# Patient Record
Sex: Female | Born: 1980
Health system: Southern US, Community
[De-identification: ages and names within clinical notes are randomized; demographics above are authoritative.]

## PROBLEM LIST (undated history)

## (undated) DIAGNOSIS — B019 Varicella without complication: Secondary | ICD-10-CM

## (undated) DIAGNOSIS — J301 Allergic rhinitis due to pollen: Secondary | ICD-10-CM

## (undated) DIAGNOSIS — R519 Headache, unspecified: Secondary | ICD-10-CM

## (undated) DIAGNOSIS — R51 Headache: Secondary | ICD-10-CM

## (undated) DIAGNOSIS — G40909 Epilepsy, unspecified, not intractable, without status epilepticus: Secondary | ICD-10-CM

## (undated) HISTORY — DX: Varicella without complication: B01.9

## (undated) HISTORY — DX: Headache, unspecified: R51.9

## (undated) HISTORY — DX: Headache: R51

## (undated) HISTORY — DX: Allergic rhinitis due to pollen: J30.1

## (undated) HISTORY — DX: Epilepsy, unspecified, not intractable, without status epilepticus: G40.909

---

## 2018-12-21 ENCOUNTER — Encounter: Payer: Self-pay | Admitting: Family Medicine

## 2018-12-21 ENCOUNTER — Ambulatory Visit (INDEPENDENT_AMBULATORY_CARE_PROVIDER_SITE_OTHER): Payer: BLUE CROSS/BLUE SHIELD

## 2018-12-21 ENCOUNTER — Ambulatory Visit (INDEPENDENT_AMBULATORY_CARE_PROVIDER_SITE_OTHER): Payer: BLUE CROSS/BLUE SHIELD | Admitting: Family Medicine

## 2018-12-21 VITALS — BP 102/80 | HR 83 | Temp 98.4°F | Resp 18 | Ht 61.0 in | Wt 137.4 lb

## 2018-12-21 DIAGNOSIS — M79672 Pain in left foot: Secondary | ICD-10-CM

## 2018-12-21 DIAGNOSIS — Z8349 Family history of other endocrine, nutritional and metabolic diseases: Secondary | ICD-10-CM

## 2018-12-21 DIAGNOSIS — R079 Chest pain, unspecified: Secondary | ICD-10-CM

## 2018-12-21 LAB — COMPREHENSIVE METABOLIC PANEL
ALT: 19 U/L (ref 0–35)
AST: 17 U/L (ref 0–37)
Albumin: 4.4 g/dL (ref 3.5–5.2)
Alkaline Phosphatase: 39 U/L (ref 39–117)
BUN: 8 mg/dL (ref 6–23)
CHLORIDE: 103 meq/L (ref 96–112)
CO2: 29 mEq/L (ref 19–32)
Calcium: 9.2 mg/dL (ref 8.4–10.5)
Creatinine, Ser: 0.58 mg/dL (ref 0.40–1.20)
GFR: 115.78 mL/min (ref >60.00)
GLUCOSE: 88 mg/dL (ref 70–99)
Potassium: 4 mEq/L (ref 3.5–5.1)
Sodium: 139 mEq/L (ref 135–145)
Total Bilirubin: 1.1 mg/dL (ref 0.2–1.2)
Total Protein: 6.7 g/dL (ref 6.0–8.3)

## 2018-12-21 LAB — CBC
HCT: 40.3 % (ref 36.0–46.0)
Hemoglobin: 13.7 g/dL (ref 12.0–15.0)
MCHC: 33.9 g/dL (ref 30.0–36.0)
MCV: 91 fl (ref 78.0–100.0)
Platelets: 233 10*3/uL (ref 150.0–400.0)
RBC: 4.44 Mil/uL (ref 3.87–5.11)
RDW: 12.5 % (ref 11.5–15.5)
WBC: 6.2 10*3/uL (ref 4.0–10.5)

## 2018-12-21 LAB — LIPID PANEL
Cholesterol: 210 mg/dL — ABNORMAL HIGH (ref 0–200)
HDL: 77.3 mg/dL (ref >39.00)
LDL Cholesterol: 120 mg/dL — ABNORMAL HIGH (ref 0–99)
NonHDL: 132.28
Total CHOL/HDL Ratio: 3
Triglycerides: 59 mg/dL (ref 0.0–149.0)
VLDL: 11.8 mg/dL (ref 0.0–40.0)

## 2018-12-21 LAB — B12 AND FOLATE PANEL
Folate: 9.8 ng/mL (ref >5.9)
Vitamin B-12: 569 pg/mL (ref 211–911)

## 2018-12-21 LAB — VITAMIN D 25 HYDROXY (VIT D DEFICIENCY, FRACTURES): VITD: 22.28 ng/mL — ABNORMAL LOW (ref 30.00–100.00)

## 2018-12-21 NOTE — Progress Notes (Signed)
Subjective:    Patient ID: Sandra Donovan, female    DOB: Dec 11, 1980, 38 y.o.   MRN: [id number]  HPI   Patient presents to clinic to establish with PCP.  Patient has a few complaints today including pain in her left foot/left big toe and also pain in her sternum for the past 2 years.  Patient denies any injury to her left foot.  But, the big toe has been hurting off and on for many months.  Patient states she did cut her toe nail slightly too short and wonders if this caused an ingrown?  Patient believes the pain in her sternum after she began lifting weights 2 years ago, says she heard a pop and now will hear a pop off and on and have some soreness in the sternum.  Denies any palpitations, feeling faint or dizzy, shortness of breath or wheezing.  Past medical, surgical, family and social history reviewed and updated in chart: Past Medical History:  Diagnosis Date  . Chicken pox   . Frequent headaches   . Hay fever   . Seizure disorder [facility](HCC)    Social History   Tobacco Use  . Smoking status: Never Smoker  . Smokeless tobacco: Never Used  Substance Use Topics  . Alcohol use: Yes    Comment: ocassionally   History reviewed. No pertinent surgical history.   Family History  Problem Relation Age of Onset  . Arthritis Mother   . Miscarriages / [place] Mother   . Arthritis Father   . Cancer Maternal Grandmother   . Alcohol abuse Maternal Grandfather   . Diabetes Maternal Grandfather   . Heart disease Maternal Grandfather   . Heart attack Maternal Grandfather   . Diabetes Paternal Grandmother   . Heart disease Paternal Grandmother   . Hearing loss Paternal Grandfather   . Cancer Paternal Grandfather     Review of Systems  Constitutional: Negative for chills, fatigue and fever.  HENT: Negative for congestion, ear pain, sinus pain and sore throat.   Eyes: Negative.   Respiratory: Negative for cough, shortness of breath and wheezing.   Cardiovascular: Negative for  palpitations and leg swelling. +central CP x2 years Gastrointestinal: Negative for abdominal pain, diarrhea, nausea and vomiting.  Genitourinary: Negative for dysuria, frequency and urgency.  Musculoskeletal: pain in left foot - left great toe Skin: Negative for color change, pallor and rash.  Neurological: Negative for syncope, light-headedness and headaches.  Psychiatric/Behavioral: The patient is not nervous/anxious.       Objective:   Physical Exam  Constitutional: She appears well-developed and well-nourished. No distress.  HENT:  Head: Normocephalic and atraumatic.  Eyes: Pupils are equal, round, and reactive to light. EOM are normal. No scleral icterus.  Neck: Normal range of motion. Neck supple. No tracheal deviation present.  Cardiovascular: Normal rate, regular rhythm and normal heart sounds. NO pain with palpation of sternum.   Pulmonary/Chest: Effort normal and breath sounds normal. No respiratory distress. She has no wheezes. She has no rales.  Abdominal: Soft. Bowel sounds are normal. There is no tenderness.  Neurological: She is alert and oriented to person, place, and time. Gait normal  Musculoskeletal: Range of motion of ankle and feet normal.  No deformity of feet. Skin: Skin is warm and dry. No pallor.  Some tenderness on right side of left great toenail, suspect possible ingrown toenail could be causing patient's pain. Psychiatric: She has a normal mood and affect. Her behavior is normal. Thought content normal.   Nursing  note and vitals reviewed.  Vitals:   12/21/18 0908  BP: 102/80  Pulse: 83  Resp: 18  Temp: 98.4 F (36.9 C)  SpO2: 97%      Assessment & Plan:   Chest pain in adult-EKG done in clinic and reviewed by me.  Shows normal sinus rhythm and unremarkable for any acute abnormality.  Pain could be from a pulled muscle, patient advised to monitor cell for any worsening pain.  We will also get chest x-ray in clinic to further evaluate.  Left foot  pain- suspect pain is related to an ingrown toenail, patient still would like foot x-ray to rule out any sort of bony abnormality.  We will get foot x-ray in clinic today.  Due to patient's family history of metabolic/intestinal disorders, we will get blood work today including CBC, CMP, lipid panel, thyroid panel, B12/folate, vitamin D.  Patient will follow-up in approximately 2 weeks for CPE and Pap smear.

## 2018-12-21 NOTE — Patient Instructions (Signed)
EKG is normal 

## 2018-12-22 LAB — THYROID PANEL WITH TSH
Free Thyroxine Index: 2.6 (ref 1.4–3.8)
T3 UPTAKE: 30 % (ref 22–35)
T4, Total: 8.6 ug/dL (ref 5.1–11.9)
TSH: 1.89 mIU/L

## 2018-12-25 ENCOUNTER — Telehealth: Payer: Self-pay

## 2018-12-25 DIAGNOSIS — M79672 Pain in left foot: Secondary | ICD-10-CM

## 2018-12-25 NOTE — Telephone Encounter (Signed)
Copied from CRM [id number]. Topic: Quick Communication - Lab Results (Clinic Use ONLY) >> Dec 25, 2018  2:19 PM Crist Infante wrote: 1.  Pt would like lab results released to mychart. 2.  referral not in the chart for podiatry as noted.

## 2018-12-25 NOTE — Addendum Note (Signed)
Addended by: Leanora Cover on: 12/25/2018 04:15 PM   Modules accepted: Orders

## 2019-01-04 ENCOUNTER — Encounter: Payer: BLUE CROSS/BLUE SHIELD | Admitting: Family Medicine

## 2019-01-11 ENCOUNTER — Ambulatory Visit (INDEPENDENT_AMBULATORY_CARE_PROVIDER_SITE_OTHER): Payer: BLUE CROSS/BLUE SHIELD | Admitting: Family Medicine

## 2019-01-11 ENCOUNTER — Encounter: Payer: Self-pay | Admitting: Family Medicine

## 2019-01-11 ENCOUNTER — Encounter: Payer: Self-pay | Admitting: Podiatry

## 2019-01-11 ENCOUNTER — Ambulatory Visit (INDEPENDENT_AMBULATORY_CARE_PROVIDER_SITE_OTHER): Payer: BLUE CROSS/BLUE SHIELD | Admitting: Podiatry

## 2019-01-11 ENCOUNTER — Other Ambulatory Visit (HOSPITAL_COMMUNITY)
Admission: RE | Admit: 2019-01-11 | Discharge: 2019-01-11 | Disposition: A | Discharge status: Home / Self Care | Payer: BLUE CROSS/BLUE SHIELD | Source: Ambulatory Visit | Attending: Family Medicine | Admitting: Family Medicine

## 2019-01-11 VITALS — BP 111/64 | HR 71

## 2019-01-11 VITALS — BP 102/68 | HR 67 | Temp 98.0°F | Resp 16 | Ht 61.0 in | Wt 139.4 lb

## 2019-01-11 DIAGNOSIS — Z124 Encounter for screening for malignant neoplasm of cervix: Secondary | ICD-10-CM

## 2019-01-11 DIAGNOSIS — Z0000 Encounter for general adult medical examination without abnormal findings: Secondary | ICD-10-CM | POA: Diagnosis not present

## 2019-01-11 DIAGNOSIS — L600 Ingrowing nail: Secondary | ICD-10-CM

## 2019-01-11 DIAGNOSIS — Z1231 Encounter for screening mammogram for malignant neoplasm of breast: Secondary | ICD-10-CM

## 2019-01-11 DIAGNOSIS — M205X9 Other deformities of toe(s) (acquired), unspecified foot: Secondary | ICD-10-CM

## 2019-01-11 DIAGNOSIS — M2020 Hallux rigidus, unspecified foot: Secondary | ICD-10-CM

## 2019-01-11 LAB — HM PAP SMEAR: HM Pap smear: NEGATIVE

## 2019-01-11 NOTE — Progress Notes (Signed)
Subjective:    Patient ID: Sandra Donovan, female    DOB: 10/25/1981, 38 y.o.   MRN: [id number]  HPI   Patient presents to clinic for complete physical exam and Pap smear.  She is feeling well today and does not have any complaints.  She is seeing podiatry later this afternoon to further investigate her foot pain.  Lab work reviewed with patient, overall labs very good.  Vitamin D slightly low but she will replace with oral vitamin D supplement.   Patient is working on [employer] and regular exercise.  She is trying to eat more vegetables and lean meats and to at least walk 5 times per week.  Patient also is doing some weight training.  Patient sees eye doctor every 1 to 2 years.  Sees dentist twice per year.  She always wears seatbelt while in vehicle.  She is happy to report that her parents have moved to [place] to be closer to her.   Vaccines are up-to-date.  Patient's maternal grandmother passed away from breast cancer at the age of 59, we will plan to order mammogram for patient today.   Social History   Tobacco Use  . Smoking status: Never Smoker  . Smokeless tobacco: Never Used  Substance Use Topics  . Alcohol use: Yes    Comment: ocassionally   History reviewed. No pertinent surgical history.  Family History  Problem Relation Age of Onset  . Arthritis Mother   . Miscarriages / [place] Mother   . Arthritis Father   . Cancer Maternal Grandmother   . Alcohol abuse Maternal Grandfather   . Diabetes Maternal Grandfather   . Heart disease Maternal Grandfather   . Heart attack Maternal Grandfather   . Diabetes Paternal Grandmother   . Heart disease Paternal Grandmother   . Hearing loss Paternal Grandfather   . Cancer Paternal Grandfather     Review of Systems  Constitutional: Negative for chills, fatigue and fever.  HENT: Negative for congestion, ear pain, sinus pain and sore throat.   Eyes: Negative.   Respiratory: Negative for cough,  shortness of breath and wheezing.   Cardiovascular: Negative for chest pain, palpitations and leg swelling.  Gastrointestinal: Negative for abdominal pain, diarrhea, nausea and vomiting.  Genitourinary: Negative for dysuria, frequency and urgency.  Musculoskeletal: Negative for arthralgias and myalgias.  Skin: Negative for color change, pallor and rash.  Neurological: Negative for syncope, light-headedness and headaches.  Psychiatric/Behavioral: The patient is not nervous/anxious.       Objective:   Physical Exam Vitals signs and nursing note reviewed.  Constitutional:      Appearance: She is well-developed.  HENT:     Head: Normocephalic and atraumatic.     Right Ear: External ear normal.     Left Ear: External ear normal.     Nose: Nose normal.  Eyes:     General: No scleral icterus.       Right eye: No discharge.        Left eye: No discharge.     Conjunctiva/sclera: Conjunctivae normal.     Pupils: Pupils are equal, round, and reactive to light.  Neck:     Musculoskeletal: Normal range of motion and neck supple.     Trachea: No tracheal deviation.  Cardiovascular:     Rate and Rhythm: Normal rate and regular rhythm.     Heart sounds: Normal heart sounds. No murmur. No friction rub. No gallop.   Pulmonary:     Effort: Pulmonary  effort is normal. No respiratory distress.     Breath sounds: Normal breath sounds. No wheezing or rales.  Chest:     Chest wall: No tenderness.     Breasts:        Right: Normal. No swelling, bleeding, inverted nipple, mass, nipple discharge, skin change or tenderness.        Left: Normal. No swelling, bleeding, inverted nipple, mass, nipple discharge, skin change or tenderness.  Abdominal:     General: Bowel sounds are normal.     Palpations: Abdomen is soft.     Tenderness: There is no abdominal tenderness. There is no guarding.     Hernia: There is no hernia in the right inguinal area or left inguinal area.  Genitourinary:    Labia:         Right: No rash, tenderness, lesion or injury.        Left: No rash, tenderness, lesion or injury.      Vagina: Normal. No signs of injury. No vaginal discharge, tenderness, bleeding or lesions.     Cervix: Normal.     Uterus: Not tender.      Adnexa:        Right: No tenderness.         Left: No tenderness.    Musculoskeletal: Normal range of motion.        General: No deformity.  Lymphadenopathy:     Cervical: No cervical adenopathy.     Upper Body:     Right upper body: No supraclavicular, axillary or pectoral adenopathy.     Left upper body: No supraclavicular, axillary or pectoral adenopathy.     Lower Body: No right inguinal adenopathy. No left inguinal adenopathy.  Skin:    General: Skin is warm and dry.     Capillary Refill: Capillary refill takes less than 2 seconds.     Coloration: Skin is not pale.     Findings: No erythema.  Neurological:     Mental Status: She is alert and oriented to person, place, and time.     Cranial Nerves: No cranial nerve deficit.  Psychiatric:        Behavior: Behavior normal.        Thought Content: Thought content normal.     Vitals:   01/11/19 0812  BP: 102/68  Pulse: 67  Resp: 16  Temp: 98 F (36.7 C)  SpO2: 97%       Assessment & Plan:   Well adult exam, mammogram screening, Pap smear - patient appears to be a healthy 38 year old female.  Due to family history of breast cancer in maternal grandmother at a younger age, we will get mammogram ordered today.  Patient encouraged to continue following a healthy diet with lots of lean proteins, vegetables, lower carbs/lower sugars.  Also encouraged to keep up good water intake and do regular physical activity at least 3 to 5 days/week.  Patient sees eye doctor and dentist regularly.  She always wears seatbelt when in vehicle.  Also discussed skin safety in the sun, recommended wearing sunscreen of SPF of at least 30 when outdoors for extended period of time, also discussed wearing longer  sleeves and widebrimmed hat to protect skin.  Patient given information on how to call and set up her mammogram appointment she is aware someone will be contacting her in regards to Pap smear results.  Patient advised to return annually for wellness visit and is aware she can return to clinic sooner if any issues  arise.

## 2019-01-11 NOTE — Progress Notes (Signed)
This patient presents to the office stating that she is experiencing pain and discomfort along the inside border of the left great toe.  She says that there is been mild pain and discomfort for the last 2 to 3 months.  She points to an area on her toe that is red and inflamed and painful to the touch.  She states that her foot is actually better today at this visit.  She says that she has soaked her foot and trimmed her nails which has mildly helped.  She has sought no professional treatment.  She presents the office today for further evaluation and treatment of her painful left big toe.  Vascular  Dorsalis pedis and posterior tibial pulses are palpable  B/L.  Capillary return  WNL.  Temperature gradient is  WNL.  Skin turgor  WNL  Sensorium  Senn Weinstein monofilament wire  WNL. Normal tactile sensation.  Nail Exam  Patient has normal nails with no evidence of bacterial or fungal infection. Incurvation medial border left great toenail.  Orthopedic  Exam  Muscle tone and muscle strength  WNL.  No limitations of motion feet  B/L.  No crepitus or joint effusion noted.  Foot type is unremarkable and digits show no abnormalities.  Bony prominences are unremarkable. Functional hallux limitus noted B/l.  Skin  No open lesions.  Normal skin texture and turgor.   Ingrown toenail left hallux.  Functional hallux limitus  1st MPJ  B/L.  IE.  Discussed this condition with this patient.  Discussed conservative treatment versus surgical treatment.  Patient desires time to think about future treatment.  Patient was noted to have a functional hallux limitus first MPJ bilateral.  Told this patient we can check on orthotic coverage and have that information for her when she returns for nail surgery. RTC prn.  Helane Gunther DPM

## 2019-01-13 LAB — CYTOLOGY - PAP
ADEQUACY: ABSENT
Diagnosis: NEGATIVE
HPV: NOT DETECTED

## 2019-01-15 ENCOUNTER — Encounter: Payer: Self-pay | Admitting: Podiatry

## 2019-01-15 ENCOUNTER — Ambulatory Visit (INDEPENDENT_AMBULATORY_CARE_PROVIDER_SITE_OTHER): Payer: BLUE CROSS/BLUE SHIELD | Admitting: Podiatry

## 2019-01-15 DIAGNOSIS — L03032 Cellulitis of left toe: Secondary | ICD-10-CM | POA: Diagnosis not present

## 2019-01-15 DIAGNOSIS — L600 Ingrowing nail: Secondary | ICD-10-CM | POA: Diagnosis not present

## 2019-01-15 NOTE — Progress Notes (Signed)
This patient presents to the office stating that she is experiencing pain and discomfort along the inside border of the left great toe.  She returns to the office for scheduled nail surgery.  Vascular  Dorsalis pedis and posterior tibial pulses are palpable  B/L.  Capillary return  WNL.  Temperature gradient is  WNL.  Skin turgor  WNL  Sensorium  Senn Weinstein monofilament wire  WNL. Normal tactile sensation.  Nail Exam  Patient has normal nails with no evidence of bacterial or fungal infection. Incurvation medial border left great toenail.  Orthopedic  Exam  Muscle tone and muscle strength  WNL.  No limitations of motion feet  B/L.  No crepitus or joint effusion noted.  Foot type is unremarkable and digits show no abnormalities.  Bony prominences are unremarkable. Functional hallux limitus noted B/l.  Skin  No open lesions.  Normal skin texture and turgor.   Ingrown toenail left hallux.   IE.  Discussed this condition with this patient.  Nail surgery left hallux.  Treatment options and alternatives discussed.  Recommended permanent phenol matrixectomy and patient agreed.  Left hallux  was prepped with alcohol and a toe block of 3cc of 2% lidocaine plain was administered in a digital toe block. .  The toe was then prepped with betadine solution . A tourniquet was applied to toe. The offending nail border was then excised and matrix tissue exposed.  Phenol was then applied to the matrix tissue followed by an alcohol wash.  Antibiotic ointment and a dry sterile dressing was applied.  The patient was dispensed instructions for aftercare. RTC 10 days     Helane Gunther DPM  Helane Gunther DPM

## 2019-01-15 NOTE — Patient Instructions (Signed)

## 2019-01-19 ENCOUNTER — Ambulatory Visit
Admission: RE | Admit: 2019-01-19 | Discharge: 2019-01-19 | Disposition: A | Discharge status: Home / Self Care | Payer: BLUE CROSS/BLUE SHIELD | Source: Ambulatory Visit | Attending: Family Medicine | Admitting: Family Medicine

## 2019-01-19 DIAGNOSIS — Z1231 Encounter for screening mammogram for malignant neoplasm of breast: Secondary | ICD-10-CM

## 2019-01-22 ENCOUNTER — Telehealth: Payer: Self-pay | Admitting: Podiatry

## 2019-01-23 ENCOUNTER — Other Ambulatory Visit: Payer: Self-pay | Admitting: Family Medicine

## 2019-01-23 DIAGNOSIS — R928 Other abnormal and inconclusive findings on diagnostic imaging of breast: Secondary | ICD-10-CM

## 2019-01-25 ENCOUNTER — Encounter: Payer: Self-pay | Admitting: Podiatry

## 2019-01-25 ENCOUNTER — Ambulatory Visit
Admission: RE | Admit: 2019-01-25 | Discharge: 2019-01-25 | Disposition: A | Discharge status: Home / Self Care | Payer: BLUE CROSS/BLUE SHIELD | Source: Ambulatory Visit | Attending: Family Medicine | Admitting: Family Medicine

## 2019-01-25 ENCOUNTER — Ambulatory Visit (INDEPENDENT_AMBULATORY_CARE_PROVIDER_SITE_OTHER): Payer: BLUE CROSS/BLUE SHIELD | Admitting: Podiatry

## 2019-01-25 ENCOUNTER — Other Ambulatory Visit: Payer: Self-pay | Admitting: Family Medicine

## 2019-01-25 DIAGNOSIS — N6314 Unspecified lump in the right breast, lower inner quadrant: Secondary | ICD-10-CM | POA: Diagnosis not present

## 2019-01-25 DIAGNOSIS — R928 Other abnormal and inconclusive findings on diagnostic imaging of breast: Secondary | ICD-10-CM

## 2019-01-25 DIAGNOSIS — M205X9 Other deformities of toe(s) (acquired), unspecified foot: Secondary | ICD-10-CM

## 2019-01-25 DIAGNOSIS — R922 Inconclusive mammogram: Secondary | ICD-10-CM | POA: Diagnosis not present

## 2019-01-25 DIAGNOSIS — N63 Unspecified lump in unspecified breast: Secondary | ICD-10-CM

## 2019-01-25 DIAGNOSIS — Z09 Encounter for follow-up examination after completed treatment for conditions other than malignant neoplasm: Secondary | ICD-10-CM

## 2019-01-25 DIAGNOSIS — M2020 Hallux rigidus, unspecified foot: Secondary | ICD-10-CM

## 2019-01-25 NOTE — Progress Notes (Signed)
This patient returns to the office following nail surgery ten days  ago.  The patient says toe has been soaked and bandaged as directed.  There has been improvement of the toe since the surgery has been performed. The patient presents for continued evaluation and treatment.  GENERAL APPEARANCE: Alert, conversant. Appropriately groomed. No acute distress.  VASCULAR: Pedal pulses palpable at  [employer] and PT bilateral.  Capillary refill time is immediate to all digits,  Normal temperature gradient.    NEUROLOGIC: sensation is normal to 5.07 monofilament at 5/5 sites bilateral.  Light touch is intact bilateral, Muscle strength normal.  MUSCULOSKELETAL: acceptable muscle strength, tone and stability bilateral.  Intrinsic muscluature intact bilateral.  Rectus appearance of foot and digits noted bilateral.   DERMATOLOGIC: skin color, texture, and turgor are within normal limits.  No preulcerative lesions or ulcers  are seen, no interdigital maceration noted.   NAILS  There is necrotic tissue along the nail groove  In the absence of redness swelling and pain.  DX  S/p nail surgery  ROV  Home instructions were discussed.  Patient to call the office if there are any questions or concerns. Patient wishes to acquire kinetic wedge orthoses from [facility].  She will be scheduled with Raiford Noble in March.   Helane Gunther DPM

## 2019-02-21 ENCOUNTER — Other Ambulatory Visit: Payer: BLUE CROSS/BLUE SHIELD | Admitting: Orthotics

## 2019-04-10 ENCOUNTER — Encounter: Payer: Self-pay | Admitting: Podiatry

## 2019-07-30 ENCOUNTER — Other Ambulatory Visit: Payer: Self-pay | Admitting: Family Medicine

## 2019-07-30 ENCOUNTER — Other Ambulatory Visit: Payer: Self-pay

## 2019-07-30 ENCOUNTER — Ambulatory Visit
Admission: RE | Admit: 2019-07-30 | Discharge: 2019-07-30 | Disposition: A | Discharge status: Home / Self Care | Payer: BC Managed Care – PPO | Source: Ambulatory Visit | Attending: Family Medicine | Admitting: Family Medicine

## 2019-07-30 DIAGNOSIS — N63 Unspecified lump in unspecified breast: Secondary | ICD-10-CM

## 2019-07-30 DIAGNOSIS — N6314 Unspecified lump in the right breast, lower inner quadrant: Secondary | ICD-10-CM | POA: Diagnosis not present

## 2019-07-31 ENCOUNTER — Other Ambulatory Visit: Payer: Self-pay | Admitting: Family Medicine

## 2019-07-31 DIAGNOSIS — N6310 Unspecified lump in the right breast, unspecified quadrant: Secondary | ICD-10-CM

## 2019-07-31 NOTE — Progress Notes (Signed)
Breast imaging 6 month follow ups ordered.

## 2019-11-13 ENCOUNTER — Encounter: Payer: Self-pay | Admitting: Family Medicine

## 2019-12-06 ENCOUNTER — Ambulatory Visit: Payer: BLUE CROSS/BLUE SHIELD | Admitting: Family Medicine

## 2020-01-14 ENCOUNTER — Ambulatory Visit: Payer: BLUE CROSS/BLUE SHIELD | Admitting: Family Medicine

## 2020-02-01 ENCOUNTER — Ambulatory Visit
Admission: RE | Admit: 2020-02-01 | Discharge: 2020-02-01 | Disposition: A | Discharge status: Home / Self Care | Payer: BC Managed Care – PPO | Source: Ambulatory Visit | Attending: Family Medicine | Admitting: Family Medicine

## 2020-02-01 ENCOUNTER — Other Ambulatory Visit: Payer: Self-pay

## 2020-02-01 DIAGNOSIS — N63 Unspecified lump in unspecified breast: Secondary | ICD-10-CM

## 2020-02-01 DIAGNOSIS — R922 Inconclusive mammogram: Secondary | ICD-10-CM | POA: Diagnosis not present

## 2020-02-01 DIAGNOSIS — N6489 Other specified disorders of breast: Secondary | ICD-10-CM | POA: Diagnosis not present

## 2020-03-24 ENCOUNTER — Ambulatory Visit: Payer: BC Managed Care – PPO | Admitting: Family Medicine

## 2020-03-24 ENCOUNTER — Encounter: Payer: Self-pay | Admitting: Family Medicine

## 2020-03-24 ENCOUNTER — Other Ambulatory Visit: Payer: Self-pay

## 2020-03-24 VITALS — BP 124/64 | HR 64 | Ht 61.0 in | Wt 146.0 lb

## 2020-03-24 DIAGNOSIS — Z7689 Persons encountering health services in other specified circumstances: Secondary | ICD-10-CM

## 2020-03-24 NOTE — Progress Notes (Signed)
Date:  03/24/2020   Name:  Sandra Donovan   DOB:  1981-09-12   MRN:  [id number]   Chief Complaint: Establish Care (needed  pcp- her last one moved out of area)  Patient is a 39  year old female who presents for a comprehensive physical exam. The patient reports the following problems: none. Health maintenance has been reviewed up to date.   Lab Results  Component Value Date   CREATININE 0.58 12/21/2018   BUN 8 12/21/2018   NA 139 12/21/2018   K 4.0 12/21/2018   CL 103 12/21/2018   CO2 29 12/21/2018   Lab Results  Component Value Date   CHOL 210 (H) 12/21/2018   HDL 77.30 12/21/2018   LDLCALC 120 (H) 12/21/2018   TRIG 59.0 12/21/2018   CHOLHDL 3 12/21/2018   Lab Results  Component Value Date   TSH 1.89 12/21/2018   No results found for: HGBA1C Lab Results  Component Value Date   WBC 6.2 12/21/2018   HGB 13.7 12/21/2018   HCT 40.3 12/21/2018   MCV 91.0 12/21/2018   PLT 233.0 12/21/2018   Lab Results  Component Value Date   ALT 19 12/21/2018   AST 17 12/21/2018   ALKPHOS 39 12/21/2018   BILITOT 1.1 12/21/2018     Review of Systems  Constitutional: Negative.  Negative for chills, fatigue, fever and unexpected weight change.  HENT: Negative for congestion, ear discharge, ear pain, rhinorrhea, sinus pressure, sneezing and sore throat.   Eyes: Negative for photophobia, pain, discharge, redness and itching.  Respiratory: Negative for cough, shortness of breath, wheezing and stridor.   Cardiovascular: Negative for chest pain, palpitations and leg swelling.  Gastrointestinal: Negative for abdominal pain, blood in stool, constipation, diarrhea, nausea and vomiting.  Endocrine: Negative for cold intolerance, heat intolerance, polydipsia, polyphagia and polyuria.  Genitourinary: Negative for dysuria, flank pain, frequency, hematuria, menstrual problem, pelvic pain, urgency, vaginal bleeding and vaginal discharge.  Musculoskeletal: Negative for arthralgias, back pain  and myalgias.  Skin: Negative for rash.  Allergic/Immunologic: Negative for environmental allergies and food allergies.  Neurological: Negative for dizziness, weakness, light-headedness, numbness and headaches.  Hematological: Negative for adenopathy. Does not bruise/bleed easily.  Psychiatric/Behavioral: Negative for dysphoric mood. The patient is not nervous/anxious.     There are no problems to display for this patient.   No Known Allergies  History reviewed. No pertinent surgical history.  Social History   Tobacco Use  . Smoking status: Never Smoker  . Smokeless tobacco: Never Used  Substance Use Topics  . Alcohol use: Yes    Comment: ocassionally  . Drug use: Never     Medication list has been reviewed and updated.  No outpatient medications have been marked as taking for the 03/24/20 encounter (Office Visit) with Duanne Limerick, MD.    [facility] 2/9 Scores 03/24/2020  PHQ - 2 Score 1  PHQ- 9 Score 4    BP Readings from Last 3 Encounters:  03/24/20 124/64  01/11/19 111/64  01/11/19 102/68    Physical Exam Vitals and nursing note reviewed.  Constitutional:      Appearance: She is well-developed and normal weight.  HENT:     Head: Normocephalic.     Right Ear: Tympanic membrane, ear canal and external ear normal.     Left Ear: Tympanic membrane, ear canal and external ear normal.     Nose: Nose normal. No congestion or rhinorrhea.  Eyes:     General: Lids are everted, no  foreign bodies appreciated. No scleral icterus.       Left eye: No foreign body or hordeolum.     Conjunctiva/sclera: Conjunctivae normal.     Right eye: Right conjunctiva is not injected.     Left eye: Left conjunctiva is not injected.     Pupils: Pupils are equal, round, and reactive to light.  Neck:     Thyroid: No thyromegaly.     Vascular: No carotid bruit or JVD.     Trachea: No tracheal deviation.  Cardiovascular:     Rate and Rhythm: Normal rate and regular rhythm.     Heart  sounds: Normal heart sounds. No murmur. No friction rub. No gallop.   Pulmonary:     Effort: Pulmonary effort is normal. No respiratory distress.     Breath sounds: Normal breath sounds. No wheezing, rhonchi or rales.  Abdominal:     General: Bowel sounds are normal.     Palpations: Abdomen is soft. There is no mass.     Tenderness: There is no abdominal tenderness. There is no guarding or rebound.  Musculoskeletal:        General: No tenderness. Normal range of motion.     Cervical back: Normal range of motion and neck supple.  Lymphadenopathy:     Cervical: No cervical adenopathy.  Skin:    General: Skin is warm.     Findings: No rash.  Neurological:     Mental Status: She is alert and oriented to person, place, and time.     Cranial Nerves: No cranial nerve deficit.     Deep Tendon Reflexes: Reflexes normal.  Psychiatric:        Mood and Affect: Mood is not anxious or depressed.     Wt Readings from Last 3 Encounters:  03/24/20 146 lb (66.2 kg)  01/11/19 139 lb 6.4 oz (63.2 kg)  12/21/18 137 lb 6.4 oz (62.3 kg)    BP 124/64   Pulse 64   Ht 5\' 1"  (1.549 m)   Wt 146 lb (66.2 kg)   LMP 03/06/2020 (Approximate)   BMI 27.59 kg/m   Assessment and Plan:  1. Establishing care with new doctor, encounter for Patient newly establishing with me as physician from another 69 office in the area.  Patient is unremarkable in terms of past medical history, most recent labs, most recent imaging, and care everywhere.  Patient will continue to get well woman exams with her gynecologist and have his been encouraged to return as needed any medical concerns.

## 2020-09-23 NOTE — Telephone Encounter (Signed)
error 

## 2020-11-16 ENCOUNTER — Encounter: Payer: Self-pay | Admitting: Family Medicine

## 2020-11-16 ENCOUNTER — Ambulatory Visit
Admission: RE | Admit: 2020-11-16 | Discharge: 2020-11-16 | Disposition: A | Discharge status: Home / Self Care | Payer: BC Managed Care – PPO | Source: Ambulatory Visit | Attending: Physician Assistant | Admitting: Physician Assistant

## 2020-11-16 ENCOUNTER — Other Ambulatory Visit: Payer: Self-pay

## 2020-11-16 VITALS — BP 127/75 | HR 77 | Temp 98.8°F | Resp 14 | Ht 61.0 in | Wt 140.0 lb

## 2020-11-16 DIAGNOSIS — L0291 Cutaneous abscess, unspecified: Secondary | ICD-10-CM

## 2020-11-16 MED ORDER — DOXYCYCLINE HYCLATE 100 MG PO CAPS: 100.0000 mg | ORAL_CAPSULE | Freq: Two times a day (BID) | ORAL | 0 refills | Status: AC

## 2020-11-16 NOTE — ED Provider Notes (Signed)
[facility]    CSN: [id number] Arrival date & time: 11/16/20  1346      History   Chief Complaint Chief Complaint  Patient presents with  . Abscess    HPI Sandra Donovan is a 39 y.o. female presenting for abscess of the right thigh x 3 weeks.  Patient states that whenever she first noticed the bump it was red and appeared to have a whitehead.  She denies ever noticing any drainage.  She says that it seemed to get better but then has gotten a little worse over the past week.  Admits to intermittent peeling skin around the area.  She states she has been using warm compresses and applying Neosporin which has not really helped.  Patient states that the lump seems to be enlarging.  She says it is not really painful.  Denies any associated fevers.  No history of recurrent skin infections or MRSA.  She is otherwise healthy.  Has no other complaints or concerns today.  HPI  Past Medical History:  Diagnosis Date  . Chicken pox   . Frequent headaches   . Hay fever   . Seizure disorder [facility])    child- last seizure month ago    There are no problems to display for this patient.   History reviewed. No pertinent surgical history.  OB History   No obstetric history on file.      Home Medications    Prior to Admission medications   Medication Sig Start Date End Date Taking? Authorizing Provider  doxycycline (VIBRAMYCIN) 100 MG capsule Take 1 capsule (100 mg total) by mouth 2 (two) times daily for 7 days. 11/16/20 11/23/20  Shirlee Latch, PA-C    Family History Family History  Problem Relation Age of Onset  . Arthritis Mother   . Miscarriages / [place] Mother   . Arthritis Father   . Cancer Maternal Grandmother   . Breast cancer Maternal Grandmother   . Alcohol abuse Maternal Grandfather   . Diabetes Maternal Grandfather   . Heart disease Maternal Grandfather   . Heart attack Maternal Grandfather   . Diabetes Paternal Grandmother   . Heart disease  Paternal Grandmother   . Hypertension Paternal Grandmother   . Hearing loss Paternal Grandfather   . Cancer Paternal Grandfather     Social History Social History   Tobacco Use  . Smoking status: Never Smoker  . Smokeless tobacco: Never Used  Vaping Use  . Vaping Use: Never used  Substance Use Topics  . Alcohol use: Yes    Comment: ocassionally  . Drug use: Never     Allergies   Patient has no known allergies.   Review of Systems Review of Systems  Constitutional: Negative for fatigue and fever.  Musculoskeletal: Negative for arthralgias and myalgias.  Skin: Positive for color change. Negative for rash.  Neurological: Negative for weakness.  Hematological: Negative for adenopathy.     Physical Exam Triage Vital Signs ED Triage Vitals  Enc Vitals Group     BP 11/16/20 1355 127/75     Pulse Rate 11/16/20 1355 77     Resp 11/16/20 1355 14     Temp 11/16/20 1355 98.8 F (37.1 C)     Temp Source 11/16/20 1355 Oral     SpO2 11/16/20 1355 96 %     Weight 11/16/20 1352 140 lb (63.5 kg)     Height 11/16/20 1352 5\' 1"  (1.549 m)     Head Circumference --  Peak Flow --      Pain Score 11/16/20 1352 0     Pain Loc --      Pain Edu? --      Excl. in GC? --    No data found.  Updated Vital Signs BP 127/75 (BP Location: Left Arm)   Pulse 77   Temp 98.8 F (37.1 C) (Oral)   Resp 14   Ht 5\' 1"  (1.549 m)   Wt 140 lb (63.5 kg)   LMP 11/05/2020 (Exact Date)   SpO2 96%   BMI 26.45 kg/m       Physical Exam Vitals and nursing note reviewed.  Constitutional:      General: She is not in acute distress.    Appearance: Normal appearance. She is not ill-appearing or toxic-appearing.  HENT:     Head: Normocephalic and atraumatic.  Eyes:     General: No scleral icterus.       Right eye: No discharge.        Left eye: No discharge.     Conjunctiva/sclera: Conjunctivae normal.  Cardiovascular:     Rate and Rhythm: Normal rate and regular rhythm.  Pulmonary:      Effort: Pulmonary effort is normal. No respiratory distress.  Musculoskeletal:     Cervical back: Neck supple.  Skin:    General: Skin is dry.     Findings: Lesion (quarter sized area of induration and erythema, no fluctuance or warmth, mildly tender, no drainage) present.  Neurological:     General: No focal deficit present.     Mental Status: She is alert. Mental status is at baseline.     Motor: No weakness.     Gait: Gait normal.  Psychiatric:        Mood and Affect: Mood normal.        Behavior: Behavior normal.        Thought Content: Thought content normal.      UC Treatments / Results  Labs (all labs ordered are listed, but only abnormal results are displayed) Labs Reviewed - No data to display  EKG   Radiology No results found.  Procedures Procedures (including critical care time)  Medications Ordered in UC Medications - No data to display  Initial Impression / Assessment and Plan / UC Course  I have reviewed the triage vital signs and the nursing notes.  Pertinent labs & imaging results that were available during my care of the patient were reviewed by me and considered in my medical decision making (see chart for details).   39 year old female presenting for right inner thigh.  On exam, there is a area of induration without fluctuance.  I do not believe an I&D would yield much and this should heal just fine with oral antibiotics.  Treating with doxycycline since she says that it did appear to be pustular at onset.  Advised continue warm compresses.  Advised to follow-up with our department as needed for any new or worsening symptoms or if if not better after completing antibiotics.  Final Clinical Impressions(s) / UC Diagnoses   Final diagnoses:  Abscess     Discharge Instructions     Begin antibiotics.  Continue warm compresses.  Can also take warm baths.  The area may drain over the next couple of days.  If for some reason the redness and swelling  enlarges or he develop a fever or any worsening symptoms, he should be seen again.    ED Prescriptions    Medication  Sig Dispense Auth. Provider   doxycycline (VIBRAMYCIN) 100 MG capsule Take 1 capsule (100 mg total) by mouth 2 (two) times daily for 7 days. 14 capsule Shirlee Latch, PA-C     PDMP not reviewed this encounter.   Shirlee Latch, PA-C 11/16/20 1416

## 2020-11-16 NOTE — ED Triage Notes (Signed)
Patient c/o abscess on her right thigh for the past 3 weeks.  Patient denies fevers.  Patient denies drainage.

## 2020-11-16 NOTE — Discharge Instructions (Signed)
Begin antibiotics.  Continue warm compresses.  Can also take warm baths.  The area may drain over the next couple of days.  If for some reason the redness and swelling enlarges or he develop a fever or any worsening symptoms, he should be seen again.

## 2020-12-26 ENCOUNTER — Encounter: Payer: Self-pay | Admitting: Family Medicine

## 2021-01-23 ENCOUNTER — Ambulatory Visit (INDEPENDENT_AMBULATORY_CARE_PROVIDER_SITE_OTHER): Payer: BC Managed Care – PPO | Admitting: Family Medicine

## 2021-01-23 ENCOUNTER — Other Ambulatory Visit: Payer: Self-pay

## 2021-01-23 ENCOUNTER — Encounter: Payer: Self-pay | Admitting: Family Medicine

## 2021-01-23 VITALS — BP 128/62 | HR 60 | Ht 61.0 in | Wt 147.0 lb

## 2021-01-23 DIAGNOSIS — Z1239 Encounter for other screening for malignant neoplasm of breast: Secondary | ICD-10-CM

## 2021-01-23 DIAGNOSIS — Z1231 Encounter for screening mammogram for malignant neoplasm of breast: Secondary | ICD-10-CM | POA: Diagnosis not present

## 2021-01-23 DIAGNOSIS — Z0000 Encounter for general adult medical examination without abnormal findings: Secondary | ICD-10-CM

## 2021-01-23 DIAGNOSIS — E785 Hyperlipidemia, unspecified: Secondary | ICD-10-CM

## 2021-01-23 NOTE — Progress Notes (Signed)
Date:  01/23/2021   Name:  Sandra Donovan   DOB:  1980/12/31   MRN:  [id number]   Chief Complaint: Annual Exam (No pap)  Patient is a 40 year old female who presents for a comprehensive physical exam. The patient reports the following problems: none. Health maintenance has been reviewed mammogram/scheduled  Gastroesophageal Reflux She reports no abdominal pain, no belching, no chest pain, no choking, no coughing, no dysphagia, no early satiety, no globus sensation, no heartburn, no hoarse voice, no nausea, no sore throat, no stridor, no water brash or no wheezing. This is a chronic problem. The current episode started more than 1 year ago. The problem occurs frequently. The problem has been gradually improving. Pertinent negatives include no fatigue.    Lab Results  Component Value Date   CREATININE 0.58 12/21/2018   BUN 8 12/21/2018   NA 139 12/21/2018   K 4.0 12/21/2018   CL 103 12/21/2018   CO2 29 12/21/2018   Lab Results  Component Value Date   CHOL 210 (H) 12/21/2018   HDL 77.30 12/21/2018   LDLCALC 120 (H) 12/21/2018   TRIG 59.0 12/21/2018   CHOLHDL 3 12/21/2018   Lab Results  Component Value Date   TSH 1.89 12/21/2018   No results found for: HGBA1C Lab Results  Component Value Date   WBC 6.2 12/21/2018   HGB 13.7 12/21/2018   HCT 40.3 12/21/2018   MCV 91.0 12/21/2018   PLT 233.0 12/21/2018   Lab Results  Component Value Date   ALT 19 12/21/2018   AST 17 12/21/2018   ALKPHOS 39 12/21/2018   BILITOT 1.1 12/21/2018     Review of Systems  Constitutional: Negative.  Negative for chills, fatigue, fever and unexpected weight change.  HENT: Negative for congestion, ear discharge, ear pain, hoarse voice, rhinorrhea, sinus pressure, sneezing and sore throat.   Eyes: Negative for double vision, photophobia, pain, discharge, redness and itching.  Respiratory: Negative for cough, choking, shortness of breath, wheezing and stridor.   Cardiovascular: Negative for  chest pain.  Gastrointestinal: Negative for abdominal pain, blood in stool, constipation, diarrhea, dysphagia, heartburn, nausea and vomiting.  Endocrine: Negative for cold intolerance, heat intolerance, polydipsia, polyphagia and polyuria.  Genitourinary: Negative for dysuria, flank pain, frequency, hematuria, menstrual problem, pelvic pain, urgency, vaginal bleeding and vaginal discharge.  Musculoskeletal: Negative for arthralgias, back pain and myalgias.  Skin: Negative for rash.  Allergic/Immunologic: Negative for environmental allergies and food allergies.  Neurological: Negative for dizziness, weakness, light-headedness, numbness and headaches.  Hematological: Negative for adenopathy. Does not bruise/bleed easily.  Psychiatric/Behavioral: Negative for dysphoric mood. The patient is not nervous/anxious.     There are no problems to display for this patient.   No Known Allergies  No past surgical history on file.  Social History   Tobacco Use  . Smoking status: Never Smoker  . Smokeless tobacco: Never Used  Vaping Use  . Vaping Use: Never used  Substance Use Topics  . Alcohol use: Yes    Comment: ocassionally  . Drug use: Never     Medication list has been reviewed and updated.  No outpatient medications have been marked as taking for the 01/23/21 encounter (Office Visit) with Duanne Limerick, MD.    [facility] 2/9 Scores 01/23/2021 03/24/2020  PHQ - 2 Score 1 1  PHQ- 9 Score 2 4    GAD 7 : Generalized Anxiety Score 01/23/2021 03/24/2020  Nervous, Anxious, on Edge 0 0  Control/stop worrying 1 0  Worry too much - different things 1 0  Trouble relaxing 0 0  Restless 0 0  Easily annoyed or irritable 0 1  Afraid - awful might happen 1 1  Total GAD 7 Score 3 2  Anxiety Difficulty Not difficult at all Not difficult at all    BP Readings from Last 3 Encounters:  01/23/21 128/62  11/16/20 127/75  03/24/20 124/64    Physical Exam Vitals and nursing note reviewed.   Constitutional:      General: She is not in acute distress.    Appearance: Normal appearance. She is well-developed, well-groomed and normal weight. She is not diaphoretic.  HENT:     Head: Normocephalic and atraumatic.     Jaw: There is normal jaw occlusion.     Right Ear: Hearing, tympanic membrane, ear canal and external ear normal. There is no impacted cerumen.     Left Ear: Hearing, tympanic membrane, ear canal and external ear normal. There is no impacted cerumen.     Nose: Nose normal.     Right Turbinates: Not enlarged or swollen.     Left Turbinates: Not enlarged or swollen.     Mouth/Throat:     Mouth: Oropharynx is clear and moist. Mucous membranes are moist.     Dentition: Normal dentition.     Tongue: No lesions.     Palate: No mass.     Pharynx: Oropharynx is clear. Uvula midline.  Eyes:     General: Lids are normal. Vision grossly intact. Gaze aligned appropriately.        Right eye: No discharge.        Left eye: No discharge.     Extraocular Movements: EOM normal.     Conjunctiva/sclera: Conjunctivae normal.     Pupils: Pupils are equal, round, and reactive to light.     Funduscopic exam:    Right eye: Red reflex present.        Left eye: Red reflex present. Neck:     Thyroid: No thyroid mass, thyromegaly or thyroid tenderness.     Vascular: No carotid bruit, hepatojugular reflux or JVD.     Trachea: Trachea and phonation normal.  Cardiovascular:     Rate and Rhythm: Normal rate and regular rhythm.     Chest Wall: PMI is not displaced.     Pulses: Normal pulses and intact distal pulses. No decreased pulses.          Carotid pulses are 2+ on the right side and 2+ on the left side.      Radial pulses are 2+ on the right side and 2+ on the left side.       Femoral pulses are 2+ on the right side and 2+ on the left side.      Popliteal pulses are 2+ on the right side and 2+ on the left side.       Dorsalis pedis pulses are 2+ on the right side and 2+ on the left  side.       Posterior tibial pulses are 2+ on the right side and 2+ on the left side.     Heart sounds: Normal heart sounds, S1 normal and S2 normal. No murmur heard.  No systolic murmur is present.  No diastolic murmur is present. No friction rub. No gallop. No S3 or S4 sounds.   Pulmonary:     Effort: Pulmonary effort is normal. No respiratory distress.     Breath sounds: Normal breath sounds. No stridor.  No decreased breath sounds, wheezing, rhonchi or rales.  Chest:     Chest wall: No mass.  Breasts:     Right: Normal. No swelling, bleeding, inverted nipple, mass, nipple discharge, skin change, tenderness, axillary adenopathy or supraclavicular adenopathy.     Left: Normal. No swelling, bleeding, inverted nipple, mass, nipple discharge, skin change, tenderness, axillary adenopathy or supraclavicular adenopathy.    Abdominal:     General: Bowel sounds are normal.     Palpations: Abdomen is soft. There is no hepatomegaly, splenomegaly or mass.     Tenderness: There is no abdominal tenderness. There is no right CVA tenderness, left CVA tenderness, guarding or rebound.     Hernia: No hernia is present.  Genitourinary:    Rectum: Normal. Guaiac result negative. No mass.  Musculoskeletal:        General: No edema. Normal range of motion.     Cervical back: Normal range of motion and neck supple. No tenderness.  Lymphadenopathy:     Head:     Right side of head: No submandibular adenopathy.     Left side of head: No submandibular adenopathy.     Cervical: No cervical adenopathy.     Right cervical: No superficial or deep cervical adenopathy.    Left cervical: No superficial, deep or posterior cervical adenopathy.     Upper Body:     Right upper body: No supraclavicular or axillary adenopathy.     Left upper body: No supraclavicular or axillary adenopathy.  Skin:    General: Skin is warm and dry.     Capillary Refill: Capillary refill takes less than 2 seconds.  Neurological:      General: No focal deficit present.     Mental Status: She is alert.     Cranial Nerves: Cranial nerves are intact.     Sensory: Sensation is intact.     Motor: Motor function is intact.     Deep Tendon Reflexes: Reflexes are normal and symmetric.     Reflex Scores:      Tricep reflexes are 2+ on the right side and 2+ on the left side.      Bicep reflexes are 2+ on the right side and 2+ on the left side.      Brachioradialis reflexes are 2+ on the right side and 2+ on the left side.      Patellar reflexes are 2+ on the right side and 2+ on the left side.      Achilles reflexes are 2+ on the right side and 2+ on the left side. Psychiatric:        Behavior: Behavior is cooperative.     Wt Readings from Last 3 Encounters:  01/23/21 147 lb (66.7 kg)  11/16/20 140 lb (63.5 kg)  03/24/20 146 lb (66.2 kg)    BP 128/62   Pulse 60   Ht 5\' 1"  (1.549 m)   Wt 147 lb (66.7 kg)   LMP 01/11/2021 (Exact Date)   BMI 27.78 kg/m   Assessment and Plan: 1. Annual physical exam No subjective/objective concerns noted during history and physical exam.  Patient's chart was reviewed for previous encounters and most recent labs and most recent imaging as well as care everywhere was reviewed.Sandra Donovan is a 40 y.o. female who presents today for her Complete Annual Exam. She feels well. She reports exercising . She reports she is sleeping well. Immunizations are reviewed and recommendations provided.   Age appropriate screening tests are discussed. Counseling given  for risk factor reduction interventions.  We will obtain a CMP, lipid panel and CBC at this time. - Comprehensive metabolic panel - Lipid Panel With LDL/HDL Ratio - CBC with Differential/Platelet  2. Encounter for breast cancer screening using non-mammogram modality Bimanual breast exam was done and there was no palpable masses.  Will refer to imaging for screening by mammogram and ultrasound if necessary. - US BREAST LTD UNI RIGHT INC  AXILLA; Future  3. Breast cancer screening by mammogram As noted above - MM DIAG BREAST TOMO BILATERAL; Future  4. Dyslipidemia Review of labs notes that there is mild elevation of total and LDL.  Will check lipid panel fasting and will probably treat with dietary approach. - Lipid Panel With LDL/HDL Ratio

## 2021-01-24 LAB — LIPID PANEL WITH LDL/HDL RATIO
Cholesterol, Total: 237 mg/dL — ABNORMAL HIGH (ref 100–199)
HDL: 68 mg/dL (ref >39)
LDL Chol Calc (NIH): 137 mg/dL — ABNORMAL HIGH (ref 0–99)
LDL/HDL Ratio: 2 ratio (ref 0.0–3.2)
Triglycerides: 184 mg/dL — ABNORMAL HIGH (ref 0–149)
VLDL Cholesterol Cal: 32 mg/dL (ref 5–40)

## 2021-01-24 LAB — CBC WITH DIFFERENTIAL/PLATELET
Basophils Absolute: 0.1 10*3/uL (ref 0.0–0.2)
Basos: 1 %
EOS (ABSOLUTE): 0.1 10*3/uL (ref 0.0–0.4)
Eos: 1 %
Hematocrit: 43.9 % (ref 34.0–46.6)
Hemoglobin: 14.4 g/dL (ref 11.1–15.9)
Immature Grans (Abs): 0 10*3/uL (ref 0.0–0.1)
Immature Granulocytes: 0 %
Lymphocytes Absolute: 2.2 10*3/uL (ref 0.7–3.1)
Lymphs: 31 %
MCH: 30 pg (ref 26.6–33.0)
MCHC: 32.8 g/dL (ref 31.5–35.7)
MCV: 92 fL (ref 79–97)
Monocytes Absolute: 0.5 10*3/uL (ref 0.1–0.9)
Monocytes: 7 %
Neutrophils Absolute: 4.3 10*3/uL (ref 1.4–7.0)
Neutrophils: 60 %
Platelets: 306 10*3/uL (ref 150–450)
RBC: 4.8 x10E6/uL (ref 3.77–5.28)
RDW: 11.8 % (ref 11.7–15.4)
WBC: 7.2 10*3/uL (ref 3.4–10.8)

## 2021-01-24 LAB — COMPREHENSIVE METABOLIC PANEL
ALT: 22 IU/L (ref 0–32)
AST: 34 IU/L (ref 0–40)
Albumin/Globulin Ratio: 2.2 (ref 1.2–2.2)
Albumin: 4.9 g/dL — ABNORMAL HIGH (ref 3.8–4.8)
Alkaline Phosphatase: 62 IU/L (ref 44–121)
BUN/Creatinine Ratio: 11 (ref 9–23)
BUN: 7 mg/dL (ref 6–24)
Bilirubin Total: 1 mg/dL (ref 0.0–1.2)
CO2: 22 mmol/L (ref 20–29)
Calcium: 9.4 mg/dL (ref 8.7–10.2)
Chloride: 98 mmol/L (ref 96–106)
Creatinine, Ser: 0.63 mg/dL (ref 0.57–1.00)
GFR calc Af Amer: 129 mL/min/{1.73_m2} (ref >59)
GFR calc non Af Amer: 112 mL/min/{1.73_m2} (ref >59)
Globulin, Total: 2.2 g/dL (ref 1.5–4.5)
Glucose: 87 mg/dL (ref 65–99)
Potassium: 4.2 mmol/L (ref 3.5–5.2)
Sodium: 136 mmol/L (ref 134–144)
Total Protein: 7.1 g/dL (ref 6.0–8.5)

## 2021-02-04 ENCOUNTER — Other Ambulatory Visit: Payer: Self-pay

## 2021-02-04 ENCOUNTER — Ambulatory Visit
Admission: RE | Admit: 2021-02-04 | Discharge: 2021-02-04 | Disposition: A | Discharge status: Home / Self Care | Payer: BC Managed Care – PPO | Source: Ambulatory Visit | Attending: Family Medicine | Admitting: Family Medicine

## 2021-02-04 DIAGNOSIS — R928 Other abnormal and inconclusive findings on diagnostic imaging of breast: Secondary | ICD-10-CM | POA: Diagnosis not present

## 2021-02-04 DIAGNOSIS — Z1231 Encounter for screening mammogram for malignant neoplasm of breast: Secondary | ICD-10-CM | POA: Diagnosis not present

## 2021-02-04 DIAGNOSIS — N6314 Unspecified lump in the right breast, lower inner quadrant: Secondary | ICD-10-CM | POA: Diagnosis not present

## 2021-02-04 DIAGNOSIS — N6313 Unspecified lump in the right breast, lower outer quadrant: Secondary | ICD-10-CM | POA: Diagnosis not present

## 2021-02-04 DIAGNOSIS — Z1239 Encounter for other screening for malignant neoplasm of breast: Secondary | ICD-10-CM | POA: Diagnosis not present

## 2021-08-06 ENCOUNTER — Other Ambulatory Visit: Payer: Self-pay

## 2021-08-06 ENCOUNTER — Encounter: Payer: Self-pay | Admitting: Family Medicine

## 2021-08-06 ENCOUNTER — Ambulatory Visit (INDEPENDENT_AMBULATORY_CARE_PROVIDER_SITE_OTHER): Payer: 59 | Admitting: Family Medicine

## 2021-08-06 VITALS — BP 100/60 | HR 64 | Ht 61.0 in | Wt 145.0 lb

## 2021-08-06 DIAGNOSIS — L739 Follicular disorder, unspecified: Secondary | ICD-10-CM | POA: Diagnosis not present

## 2021-08-06 DIAGNOSIS — R928 Other abnormal and inconclusive findings on diagnostic imaging of breast: Secondary | ICD-10-CM

## 2021-08-06 MED ORDER — MUPIROCIN 2 % EX OINT: 1.0000 "application " | TOPICAL_OINTMENT | Freq: Two times a day (BID) | CUTANEOUS | 1 refills | Status: DC

## 2021-08-06 MED ORDER — DOXYCYCLINE HYCLATE 100 MG PO TABS: 100.0000 mg | ORAL_TABLET | Freq: Two times a day (BID) | ORAL | 1 refills | Status: DC

## 2021-08-06 NOTE — Progress Notes (Signed)
Date:  08/06/2021   Name:  Sandra Donovan   DOB:  01/06/1981   MRN:  [id number]   Chief Complaint: Follow-up (6 month mammo R) breast 7:00 finding)  Patient is a 40 year old female who presents for a comprehensive physical exam. The patient reports the following problems: followup abn mammogram. Health maintenance has been reviewed mammography pending.     Lab Results  Component Value Date   CREATININE 0.63 01/23/2021   BUN 7 01/23/2021   NA 136 01/23/2021   K 4.2 01/23/2021   CL 98 01/23/2021   CO2 22 01/23/2021   Lab Results  Component Value Date   CHOL 237 (H) 01/23/2021   HDL 68 01/23/2021   LDLCALC 137 (H) 01/23/2021   TRIG 184 (H) 01/23/2021   CHOLHDL 3 12/21/2018   Lab Results  Component Value Date   TSH 1.89 12/21/2018   No results found for: HGBA1C Lab Results  Component Value Date   WBC 7.2 01/23/2021   HGB 14.4 01/23/2021   HCT 43.9 01/23/2021   MCV 92 01/23/2021   PLT 306 01/23/2021   Lab Results  Component Value Date   ALT 22 01/23/2021   AST 34 01/23/2021   ALKPHOS 62 01/23/2021   BILITOT 1.0 01/23/2021     Review of Systems  Constitutional:  Negative for chills and fever.  HENT:  Negative for drooling, ear discharge, ear pain and sore throat.   Respiratory:  Negative for cough, shortness of breath and wheezing.   Cardiovascular:  Negative for chest pain, palpitations and leg swelling.  Gastrointestinal:  Negative for abdominal pain, blood in stool, constipation, diarrhea and nausea.  Endocrine: Negative for polydipsia.  Genitourinary:  Negative for dysuria, frequency, hematuria and urgency.  Musculoskeletal:  Negative for back pain, myalgias and neck pain.  Skin:  Negative for rash.  Allergic/Immunologic: Negative for environmental allergies.  Neurological:  Negative for dizziness and headaches.  Hematological:  Does not bruise/bleed easily.  Psychiatric/Behavioral:  Negative for suicidal ideas. The patient is not nervous/anxious.     There are no problems to display for this patient.   No Known Allergies  No past surgical history on file.  Social History   Tobacco Use   Smoking status: Never   Smokeless tobacco: Never  Vaping Use   Vaping Use: Never used  Substance Use Topics   Alcohol use: Yes    Comment: ocassionally   Drug use: Never     Medication list has been reviewed and updated.  No outpatient medications have been marked as taking for the 08/06/21 encounter (Office Visit) with Duanne Limerick, MD.    [facility] 2/9 Scores 01/23/2021 03/24/2020  PHQ - 2 Score 1 1  PHQ- 9 Score 2 4    GAD 7 : Generalized Anxiety Score 01/23/2021 03/24/2020  Nervous, Anxious, on Edge 0 0  Control/stop worrying 1 0  Worry too much - different things 1 0  Trouble relaxing 0 0  Restless 0 0  Easily annoyed or irritable 0 1  Afraid - awful might happen 1 1  Total GAD 7 Score 3 2  Anxiety Difficulty Not difficult at all Not difficult at all    BP Readings from Last 3 Encounters:  01/23/21 128/62  11/16/20 127/75  03/24/20 124/64    Physical Exam Vitals and nursing note reviewed.  Constitutional:      Appearance: She is well-developed.  HENT:     Head: Normocephalic.     Right Ear: External  ear normal.     Left Ear: External ear normal.  Eyes:     General: Lids are everted, no foreign bodies appreciated. No scleral icterus.       Left eye: No foreign body or hordeolum.     Conjunctiva/sclera: Conjunctivae normal.     Right eye: Right conjunctiva is not injected.     Left eye: Left conjunctiva is not injected.     Pupils: Pupils are equal, round, and reactive to light.  Neck:     Thyroid: No thyromegaly.     Vascular: No JVD.     Trachea: No tracheal deviation.  Cardiovascular:     Rate and Rhythm: Normal rate and regular rhythm.     Heart sounds: Normal heart sounds. No murmur heard.   No friction rub. No gallop.  Pulmonary:     Effort: Pulmonary effort is normal. No respiratory distress.      Breath sounds: Normal breath sounds. No wheezing or rales.  Chest:  Breasts:    Right: No swelling, bleeding, inverted nipple, mass, nipple discharge, skin change or tenderness.     Left: No swelling, bleeding, inverted nipple, mass, nipple discharge, skin change or tenderness.  Abdominal:     General: Bowel sounds are normal.     Palpations: Abdomen is soft. There is no mass.     Tenderness: There is no abdominal tenderness. There is no guarding or rebound.  Musculoskeletal:        General: No tenderness. Normal range of motion.     Cervical back: Normal range of motion and neck supple.  Lymphadenopathy:     Cervical: No cervical adenopathy.  Skin:    General: Skin is warm.     Findings: Erythema and rash present. Rash is pustular.  Neurological:     Mental Status: She is alert and oriented to person, place, and time.     Cranial Nerves: No cranial nerve deficit.     Deep Tendon Reflexes: Reflexes normal.  Psychiatric:        Mood and Affect: Mood is not anxious or depressed.    Wt Readings from Last 3 Encounters:  08/06/21 145 lb (65.8 kg)  01/23/21 147 lb (66.7 kg)  11/16/20 140 lb (63.5 kg)    Ht 5\' 1"  (1.549 m)   Wt 145 lb (65.8 kg)   LMP 07/26/2021 (Exact Date)   BMI 27.40 kg/m   Assessment and Plan:  1. Abnormal mammogram of right breast Relatively new onset 6 months ago was noted to have an abnormal mammogram and plan was to repeat in 6 months.  Breast exam today there was no specific mass detected and fullness except for some mild nodularity is consistent with fibrocystic changes perhaps.  We will obtain a unilateral right breast mammogram with ultrasound if necessary. - 07/28/2021 BREAST LTD UNI RIGHT INC AXILLA - MM DIAG BREAST TOMO UNI RIGHT  2. Folliculitis Multiple areas of hair follicles with the base that is erythematous and some pustular consistent with folliculitis.  Patient has been suggested to obtain some Hibiclens to use twice a week and we will treat with  doxycycline 100 mg twice a day as well as Bactroban applied twice a day. - doxycycline (VIBRA-TABS) 100 MG tablet; Take 1 tablet (100 mg total) by mouth 2 (two) times daily.  Dispense: 20 tablet; Refill: 1 - mupirocin ointment (BACTROBAN) 2 %; Apply 1 application topically 2 (two) times daily.  Dispense: 22 g; Refill: 1

## 2021-08-07 ENCOUNTER — Ambulatory Visit: Payer: BC Managed Care – PPO | Admitting: Family Medicine

## 2021-08-19 ENCOUNTER — Encounter: Payer: Self-pay | Admitting: Family Medicine

## 2021-08-25 ENCOUNTER — Other Ambulatory Visit: Payer: Self-pay

## 2021-08-25 ENCOUNTER — Ambulatory Visit
Admission: RE | Admit: 2021-08-25 | Discharge: 2021-08-25 | Disposition: A | Discharge status: Home / Self Care | Payer: 59 | Source: Ambulatory Visit | Attending: Family Medicine | Admitting: Family Medicine

## 2021-08-25 DIAGNOSIS — R928 Other abnormal and inconclusive findings on diagnostic imaging of breast: Secondary | ICD-10-CM | POA: Insufficient documentation

## 2021-09-02 ENCOUNTER — Other Ambulatory Visit: Payer: Self-pay

## 2021-09-02 ENCOUNTER — Ambulatory Visit (INDEPENDENT_AMBULATORY_CARE_PROVIDER_SITE_OTHER): Payer: 59

## 2021-09-02 DIAGNOSIS — Z23 Encounter for immunization: Secondary | ICD-10-CM | POA: Diagnosis not present

## 2022-01-25 ENCOUNTER — Encounter: Payer: Self-pay | Admitting: Family Medicine

## 2022-01-25 ENCOUNTER — Ambulatory Visit (INDEPENDENT_AMBULATORY_CARE_PROVIDER_SITE_OTHER): Payer: 59 | Admitting: Family Medicine

## 2022-01-25 ENCOUNTER — Other Ambulatory Visit: Payer: Self-pay | Admitting: Family Medicine

## 2022-01-25 ENCOUNTER — Other Ambulatory Visit: Payer: Self-pay

## 2022-01-25 VITALS — BP 120/80 | HR 80 | Ht 61.0 in | Wt 145.0 lb

## 2022-01-25 DIAGNOSIS — N6314 Unspecified lump in the right breast, lower inner quadrant: Secondary | ICD-10-CM

## 2022-01-25 DIAGNOSIS — Z0000 Encounter for general adult medical examination without abnormal findings: Secondary | ICD-10-CM

## 2022-01-25 DIAGNOSIS — Z86018 Personal history of other benign neoplasm: Secondary | ICD-10-CM

## 2022-01-25 DIAGNOSIS — L739 Follicular disorder, unspecified: Secondary | ICD-10-CM | POA: Diagnosis not present

## 2022-01-25 LAB — HEMOCCULT GUIAC POC 1CARD (OFFICE): Fecal Occult Blood, POC: NEGATIVE

## 2022-01-25 NOTE — Progress Notes (Addendum)
Date:  01/25/2022   Name:  Sandra Donovan   DOB:  09/04/1981   MRN:  [id number]   Chief Complaint: Annual Exam (No pap- needs referral to GYN)  Patient is a 41 year old female who presents for a comprehensive physical exam. The patient reports the following problems: vulvar "bump". Health maintenance has been reviewed up to date.     Lab Results  Component Value Date   NA 136 01/23/2021   K 4.2 01/23/2021   CO2 22 01/23/2021   GLUCOSE 87 01/23/2021   BUN 7 01/23/2021   CREATININE 0.63 01/23/2021   CALCIUM 9.4 01/23/2021   GFRNONAA 112 01/23/2021   Lab Results  Component Value Date   CHOL 237 (H) 01/23/2021   HDL 68 01/23/2021   LDLCALC 137 (H) 01/23/2021   TRIG 184 (H) 01/23/2021   CHOLHDL 3 12/21/2018   Lab Results  Component Value Date   TSH 1.89 12/21/2018   No results found for: HGBA1C Lab Results  Component Value Date   WBC 7.2 01/23/2021   HGB 14.4 01/23/2021   HCT 43.9 01/23/2021   MCV 92 01/23/2021   PLT 306 01/23/2021   Lab Results  Component Value Date   ALT 22 01/23/2021   AST 34 01/23/2021   ALKPHOS 62 01/23/2021   BILITOT 1.0 01/23/2021   Lab Results  Component Value Date   VD25OH 22.28 (L) 12/21/2018     Review of Systems  Constitutional:  Negative for chills and fever.  HENT:  Negative for drooling, ear discharge, ear pain and sore throat.   Respiratory:  Negative for cough, shortness of breath and wheezing.   Cardiovascular:  Negative for chest pain, palpitations and leg swelling.  Gastrointestinal:  Negative for abdominal pain, blood in stool, constipation, diarrhea and nausea.  Endocrine: Negative for polydipsia.  Genitourinary:  Negative for dysuria, frequency, hematuria and urgency.  Musculoskeletal:  Negative for back pain, myalgias and neck pain.  Skin:  Negative for rash.  Allergic/Immunologic: Negative for environmental allergies.  Neurological:  Negative for dizziness and headaches.  Hematological:  Does not bruise/bleed  easily.  Psychiatric/Behavioral:  Negative for suicidal ideas. The patient is not nervous/anxious.    There are no problems to display for this patient.   No Known Allergies  No past surgical history on file.  Social History   Tobacco Use   Smoking status: Never   Smokeless tobacco: Never  Vaping Use   Vaping Use: Never used  Substance Use Topics   Alcohol use: Yes    Comment: ocassionally   Drug use: Never     Medication list has been reviewed and updated.  Current Meds  Medication Sig   Ascorbic Acid (VITAMIN C) 1000 MG tablet Take 1,000 mg by mouth daily.   BIOTIN PO Take 8 mg by mouth daily.   Cholecalciferol (VITAMIN D-3) 125 MCG (5000 UT) TABS Take 1 capsule by mouth daily.   Coenzyme Q10 (COQ-10 PO) Take 200 mg by mouth daily.   Magnesium Hydroxide (MAGNESIA PO) Take 325 mg by mouth daily.   Quercetin 500 MG CAPS Take 1 capsule by mouth daily.   vitamin B-12 (CYANOCOBALAMIN) 1000 MCG tablet Take 1,000 mcg by mouth daily.   Zinc 30 MG CAPS Take 1 capsule by mouth daily.    PHQ 2/9 Scores 01/25/2022 01/23/2021 03/24/2020  PHQ - 2 Score 0 1 1  PHQ- 9 Score 0 2 4    GAD 7 : Generalized Anxiety Score 01/25/2022 01/23/2021 03/24/2020  Nervous, Anxious, on Edge 0 0 0  Control/stop worrying 0 1 0  Worry too much - different things 0 1 0  Trouble relaxing 0 0 0  Restless 0 0 0  Easily annoyed or irritable 0 0 1  Afraid - awful might happen 0 1 1  Total GAD 7 Score 0 3 2  Anxiety Difficulty Not difficult at all Not difficult at all Not difficult at all    BP Readings from Last 3 Encounters:  01/25/22 120/80  08/06/21 100/60  01/23/21 128/62    Physical Exam Vitals and nursing note reviewed. Exam conducted with a chaperone present.  Constitutional:      General: She is not in acute distress.    Appearance: She is well-developed, well-groomed and normal weight. She is not diaphoretic.  HENT:     Head: Normocephalic and atraumatic.     Jaw: There is normal jaw  occlusion.     Right Ear: Hearing, tympanic membrane, ear canal and external ear normal.     Left Ear: Hearing, tympanic membrane, ear canal and external ear normal.     Nose: Nose normal.     Mouth/Throat:     Lips: Pink.     Mouth: Mucous membranes are moist.     Dentition: Normal dentition. No dental tenderness.     Tongue: No lesions.     Pharynx: Oropharynx is clear. Uvula midline. No pharyngeal swelling, oropharyngeal exudate, posterior oropharyngeal erythema or uvula swelling.     Tonsils: No tonsillar exudate or tonsillar abscesses.  Eyes:     General: Lids are normal. Lids are everted, no foreign bodies appreciated. Gaze aligned appropriately. No scleral icterus.       Right eye: No discharge.        Left eye: No foreign body, discharge or hordeolum.     Extraocular Movements: Extraocular movements intact.     Conjunctiva/sclera: Conjunctivae normal.     Right eye: Right conjunctiva is not injected.     Left eye: Left conjunctiva is not injected.     Pupils: Pupils are equal, round, and reactive to light.     Funduscopic exam:    Right eye: Red reflex present.        Left eye: Red reflex present. Neck:     Thyroid: No thyroid mass, thyromegaly or thyroid tenderness.     Vascular: No JVD.     Trachea: No tracheal deviation.  Cardiovascular:     Rate and Rhythm: Normal rate and regular rhythm.     Chest Wall: PMI is not displaced.     Pulses: Normal pulses.          Carotid pulses are 2+ on the right side and 2+ on the left side.      Radial pulses are 2+ on the right side and 2+ on the left side.       Femoral pulses are 2+ on the right side and 2+ on the left side.      Popliteal pulses are 2+ on the right side and 2+ on the left side.       Dorsalis pedis pulses are 2+ on the right side and 2+ on the left side.       Posterior tibial pulses are 2+ on the right side and 2+ on the left side.     Heart sounds: Normal heart sounds, S1 normal and S2 normal. No murmur  heard. No systolic murmur is present.  No diastolic murmur is present.  No friction rub. No gallop. No S3 or S4 sounds.  Pulmonary:     Effort: Pulmonary effort is normal. No respiratory distress.     Breath sounds: Normal breath sounds. No decreased breath sounds, wheezing, rhonchi or rales.  Chest:  Breasts:    Right: Normal. No swelling, bleeding, inverted nipple, mass, nipple discharge, skin change or tenderness.     Left: Normal. No swelling, bleeding, inverted nipple, mass, nipple discharge, skin change or tenderness.  Abdominal:     General: Bowel sounds are normal.     Palpations: Abdomen is soft. There is no hepatomegaly, splenomegaly or mass.     Tenderness: There is no abdominal tenderness. There is no guarding or rebound.     Hernia: There is no hernia in the umbilical area, ventral area, left inguinal area or right inguinal area.  Genitourinary:    General: Normal vulva.     Exam position: Supine.     Pubic Area: No rash.      Labia:        Right: No rash, tenderness or lesion.        Left: No rash, tenderness or lesion.      Rectum: Normal. Guaiac result negative. No mass.  Musculoskeletal:        General: No tenderness. Normal range of motion.     Cervical back: Normal, full passive range of motion without pain, normal range of motion and neck supple.     Thoracic back: Normal.     Lumbar back: Normal.     Right lower leg: No edema.     Left lower leg: No edema.  Lymphadenopathy:     Head:     Right side of head: No submental adenopathy.     Left side of head: No submental adenopathy.     Cervical: No cervical adenopathy.     Right cervical: No superficial, deep or posterior cervical adenopathy.    Left cervical: No superficial, deep or posterior cervical adenopathy.     Upper Body:     Right upper body: No supraclavicular or axillary adenopathy.     Left upper body: No supraclavicular or axillary adenopathy.     Lower Body: No right inguinal adenopathy. No  left inguinal adenopathy.  Skin:    General: Skin is warm and dry.     Capillary Refill: Capillary refill takes less than 2 seconds.     Findings: No abrasion, lesion or rash.     Comments: Pigmented perifollicular ares c/w past folliculitis pubic area/ no palpable mass or tenderness  Neurological:     Mental Status: She is alert and oriented to person, place, and time.     Cranial Nerves: Cranial nerves 2-12 are intact. No cranial nerve deficit.     Sensory: Sensation is intact.     Motor: Motor function is intact.     Deep Tendon Reflexes: Reflexes are normal and symmetric. Reflexes normal.     Reflex Scores:      Tricep reflexes are 2+ on the right side and 2+ on the left side.      Bicep reflexes are 2+ on the right side and 2+ on the left side.      Brachioradialis reflexes are 2+ on the right side and 2+ on the left side.      Patellar reflexes are 2+ on the right side and 2+ on the left side.      Achilles reflexes are 2+ on the right side and 2+  on the left side. Psychiatric:        Mood and Affect: Mood is not anxious or depressed.    Wt Readings from Last 3 Encounters:  01/25/22 145 lb (65.8 kg)  08/06/21 145 lb (65.8 kg)  01/23/21 147 lb (66.7 kg)    BP 120/80    Pulse 80    Ht 5\' 1"  (1.549 m)    Wt 145 lb (65.8 kg)    LMP 01/15/2022 (Exact Date)    BMI 27.40 kg/m   Assessment and Plan: Sandra Donovan is a 41 y.o. female who presents today for her Complete Annual Exam. She feels well. She reports exercising daily. She reports she is sleeping fairly well.  Patient's chart reviewed for previous encounters most recent labs most recent imaging in [place]. 1. Annual physical exam Immunizations are reviewed and recommendations provided.   Age appropriate screening tests are discussed. Counseling given for risk factor reduction interventions.  No subjective/objective findings noted during history of present illness, review of systems and physical exam.  We will obtain  labs including CBC lipid panel and comprehensive metabolic panel. - POCT occult blood stool - CBC with Differential/Platelet - Lipid Panel With LDL/HDL Ratio - Comprehensive metabolic panel  2. Folliculitis Patient with history of folliculitis will refer to dermatology for residual hyperpigmented areas - Ambulatory referral to Dermatology  3. History of uterine fibroid Patient has a history of uterine fibroid followed by gynecology had previous location.  Will refer to gynecology for maintenance and evaluation of uterine fibroid. - Ambulatory referral to Gynecology

## 2022-01-26 LAB — CBC WITH DIFFERENTIAL/PLATELET
Basophils Absolute: 0.1 10*3/uL (ref 0.0–0.2)
Basos: 1 %
EOS (ABSOLUTE): 0.1 10*3/uL (ref 0.0–0.4)
Eos: 2 %
Hematocrit: 42.7 % (ref 34.0–46.6)
Hemoglobin: 14.3 g/dL (ref 11.1–15.9)
Immature Grans (Abs): 0 10*3/uL (ref 0.0–0.1)
Immature Granulocytes: 0 %
Lymphocytes Absolute: 2.4 10*3/uL (ref 0.7–3.1)
Lymphs: 40 %
MCH: 30.7 pg (ref 26.6–33.0)
MCHC: 33.5 g/dL (ref 31.5–35.7)
MCV: 92 fL (ref 79–97)
Monocytes Absolute: 0.5 10*3/uL (ref 0.1–0.9)
Monocytes: 8 %
Neutrophils Absolute: 3 10*3/uL (ref 1.4–7.0)
Neutrophils: 49 %
Platelets: 304 10*3/uL (ref 150–450)
RBC: 4.66 x10E6/uL (ref 3.77–5.28)
RDW: 12 % (ref 11.7–15.4)
WBC: 6.1 10*3/uL (ref 3.4–10.8)

## 2022-01-26 LAB — COMPREHENSIVE METABOLIC PANEL
ALT: 22 IU/L (ref 0–32)
AST: 22 IU/L (ref 0–40)
Albumin/Globulin Ratio: 2.5 — ABNORMAL HIGH (ref 1.2–2.2)
Albumin: 4.9 g/dL — ABNORMAL HIGH (ref 3.8–4.8)
Alkaline Phosphatase: 69 IU/L (ref 44–121)
BUN/Creatinine Ratio: 12 (ref 9–23)
BUN: 7 mg/dL (ref 6–24)
Bilirubin Total: 1 mg/dL (ref 0.0–1.2)
CO2: 26 mmol/L (ref 20–29)
Calcium: 9.4 mg/dL (ref 8.7–10.2)
Chloride: 102 mmol/L (ref 96–106)
Creatinine, Ser: 0.6 mg/dL (ref 0.57–1.00)
Globulin, Total: 2 g/dL (ref 1.5–4.5)
Glucose: 92 mg/dL (ref 70–99)
Potassium: 4.7 mmol/L (ref 3.5–5.2)
Sodium: 140 mmol/L (ref 134–144)
Total Protein: 6.9 g/dL (ref 6.0–8.5)
eGFR: 115 mL/min/{1.73_m2} (ref >59)

## 2022-01-26 LAB — LIPID PANEL WITH LDL/HDL RATIO
Cholesterol, Total: 226 mg/dL — ABNORMAL HIGH (ref 100–199)
HDL: 65 mg/dL (ref >39)
LDL Chol Calc (NIH): 145 mg/dL — ABNORMAL HIGH (ref 0–99)
LDL/HDL Ratio: 2.2 ratio (ref 0.0–3.2)
Triglycerides: 90 mg/dL (ref 0–149)
VLDL Cholesterol Cal: 16 mg/dL (ref 5–40)

## 2022-01-26 NOTE — Patient Instructions (Signed)

## 2022-01-28 ENCOUNTER — Telehealth: Payer: Self-pay

## 2022-01-28 NOTE — Telephone Encounter (Signed)
[facility] referring for routine care /fibroid. Attempt to reach patient 3 times. Phone on file will rang once then hang up. Dr Jolayne Panther.

## 2022-01-28 NOTE — Telephone Encounter (Signed)
I offer patient twice appointment patient refused to schedule due to timing. Then states she will be out of town the unable April. Patient states she will contact referring provider for new referral when she returns from her trip

## 2022-02-09 ENCOUNTER — Ambulatory Visit
Admission: RE | Admit: 2022-02-09 | Discharge: 2022-02-09 | Disposition: A | Discharge status: Home / Self Care | Payer: 59 | Source: Ambulatory Visit | Attending: Family Medicine | Admitting: Family Medicine

## 2022-02-09 ENCOUNTER — Encounter: Payer: Self-pay | Admitting: Family Medicine

## 2022-02-09 ENCOUNTER — Other Ambulatory Visit: Payer: Self-pay

## 2022-02-09 DIAGNOSIS — N6314 Unspecified lump in the right breast, lower inner quadrant: Secondary | ICD-10-CM | POA: Diagnosis not present

## 2022-02-10 ENCOUNTER — Other Ambulatory Visit: Payer: Self-pay | Admitting: Family Medicine

## 2022-02-10 DIAGNOSIS — R928 Other abnormal and inconclusive findings on diagnostic imaging of breast: Secondary | ICD-10-CM

## 2022-02-10 DIAGNOSIS — N63 Unspecified lump in unspecified breast: Secondary | ICD-10-CM

## 2022-03-04 IMAGING — US US BREAST*R* LIMITED INC AXILLA
1 series · 9 of 9 positions shown · non-contrast
Comparison: Previous exam(s).

CLINICAL DATA: First six-month follow-up of probably benign right
breast 7 o'clock mass.

EXAM:
DIGITAL DIAGNOSTIC UNILATERAL RIGHT MAMMOGRAM WITH TOMOSYNTHESIS AND
CAD; ULTRASOUND RIGHT BREAST LIMITED
TECHNIQUE: Right digital diagnostic mammography and breast tomosynthesis was
performed. The images were evaluated with computer-aided detection.;
Targeted ultrasound examination of the right breast was performed

[Series 1: us breast*right* limited inc axilla · 0.04mm/px · 9 of 9 slices shown]
[im 1/9]
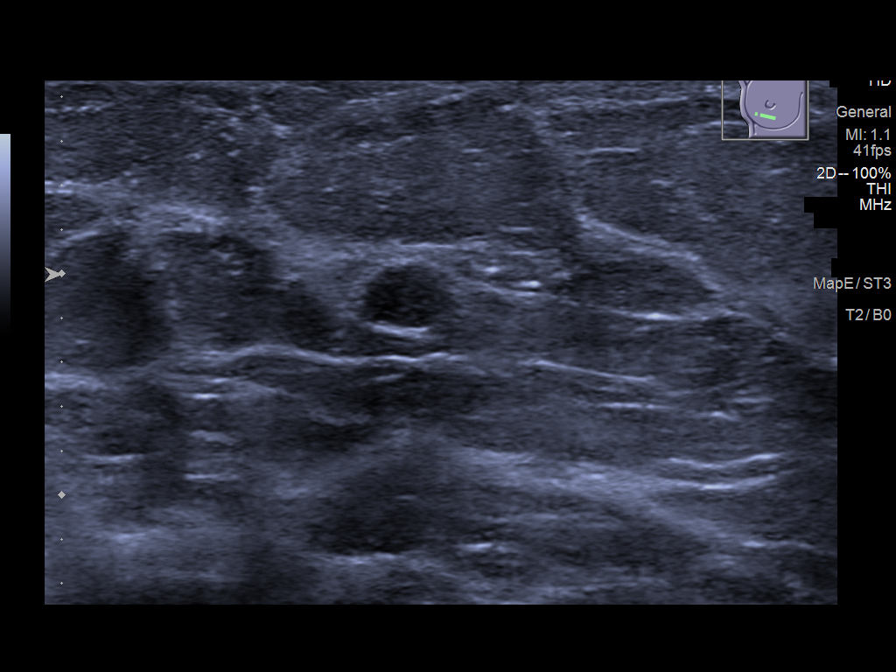
[im 2/9]
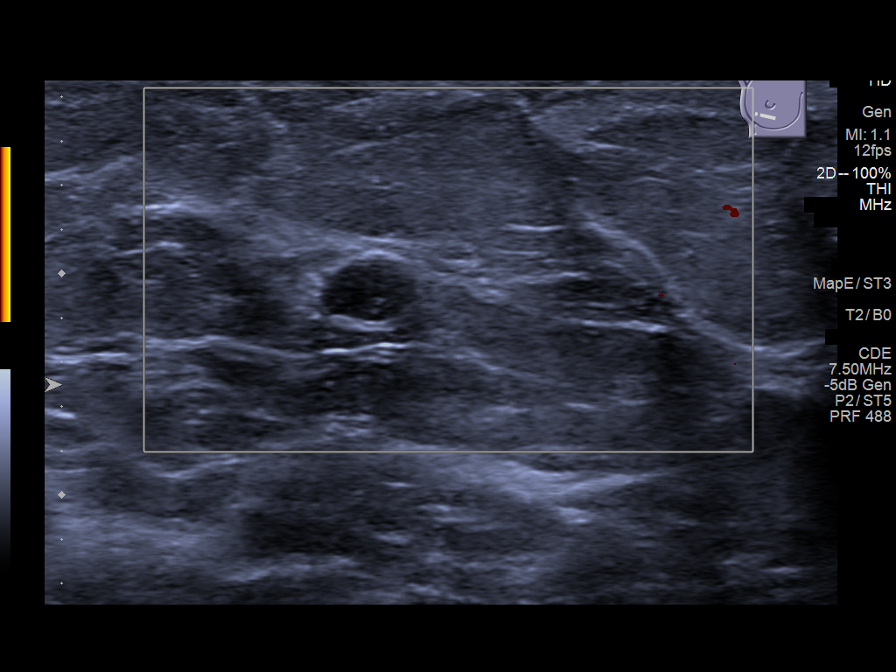
[im 3/9]
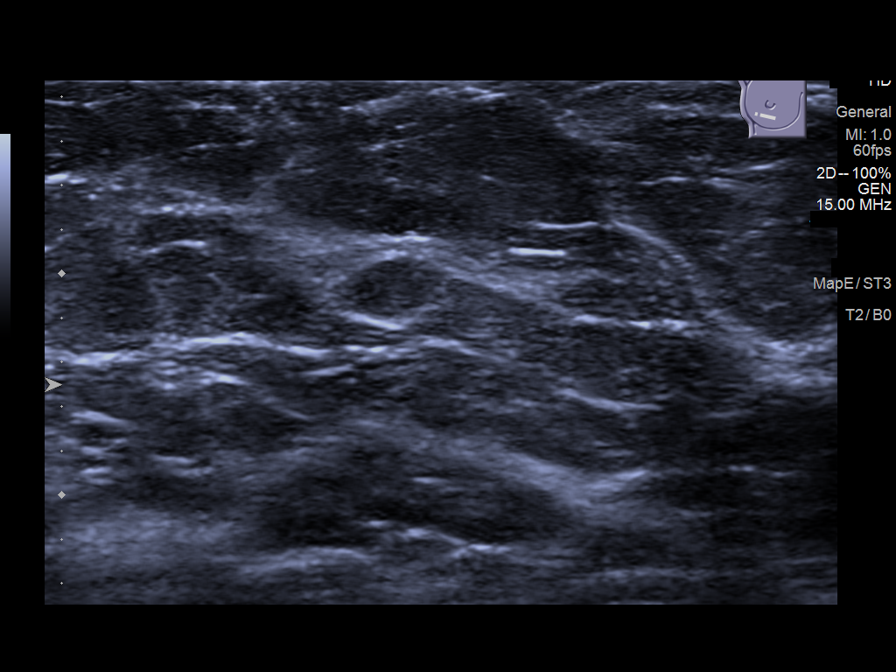
[im 4/9]
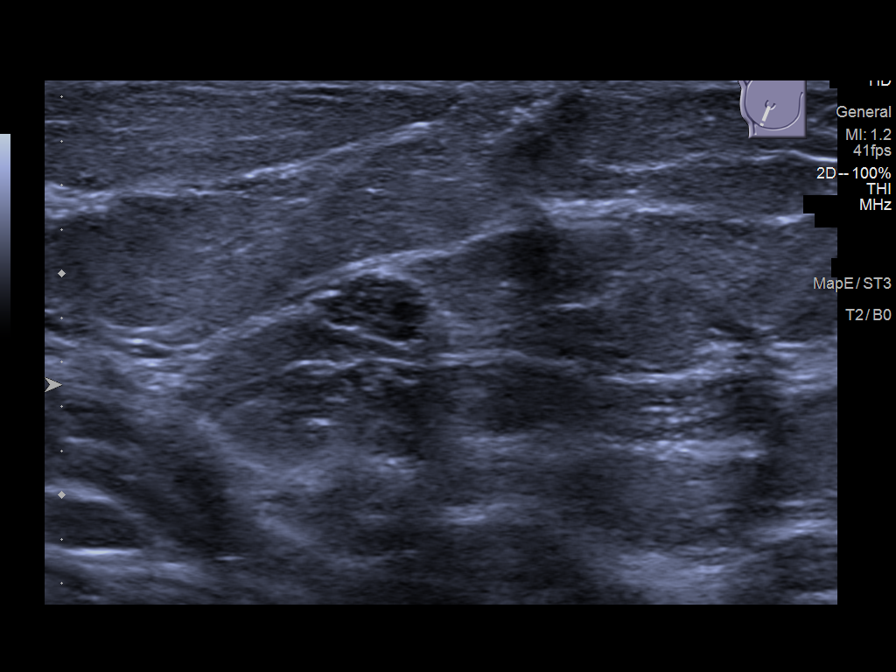
[im 5/9]
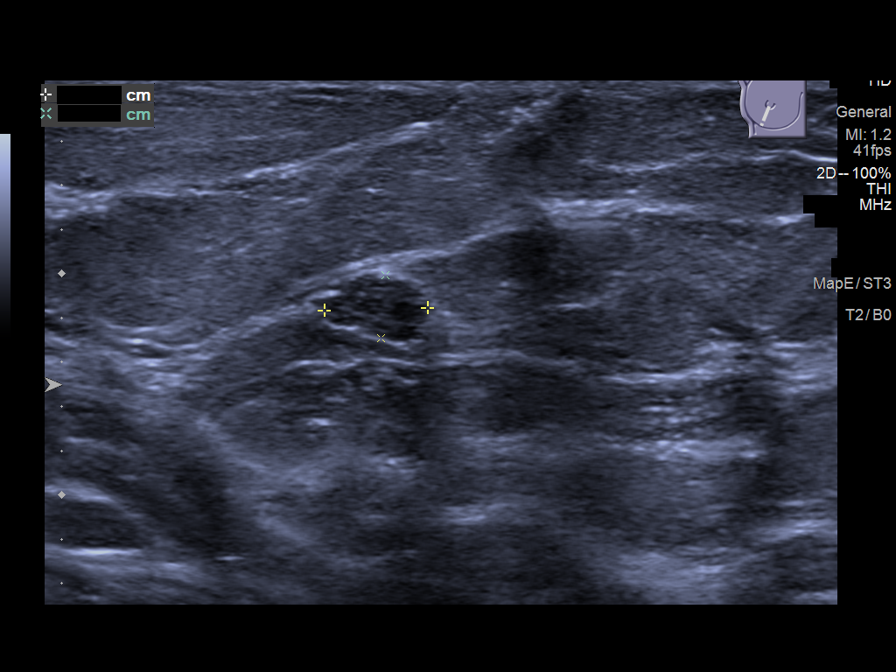
[im 6/9]
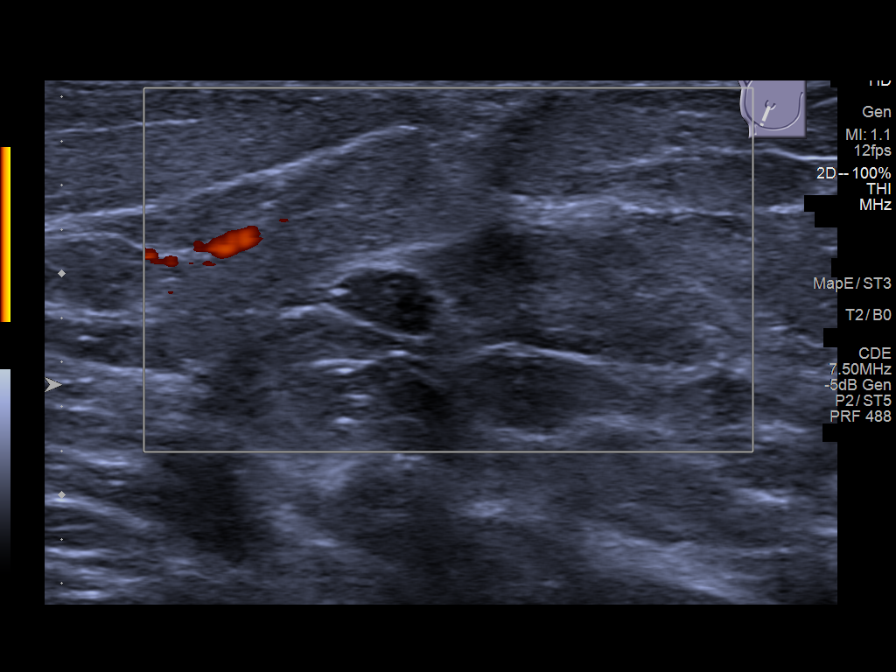
[im 7/9]
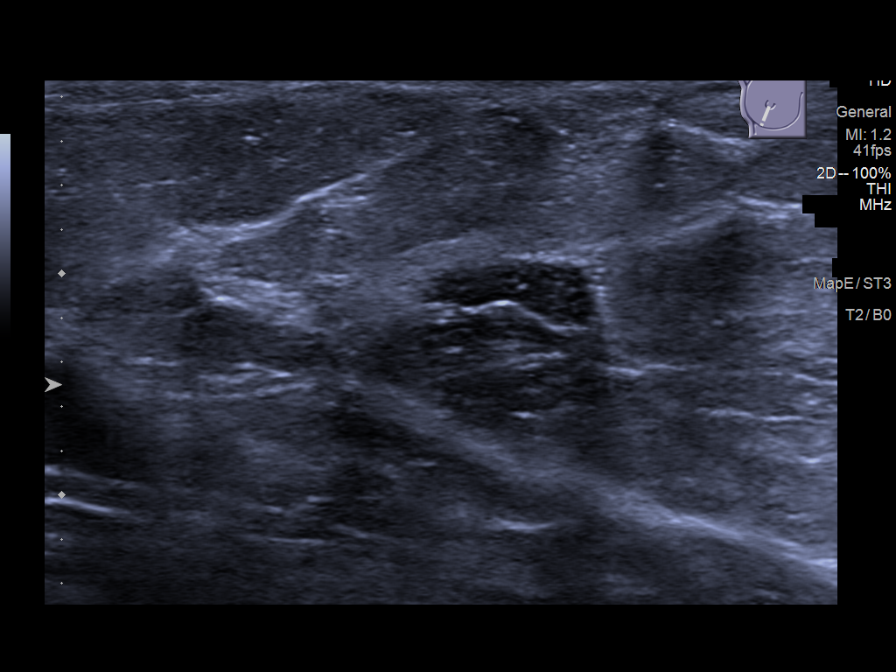
[im 8/9]
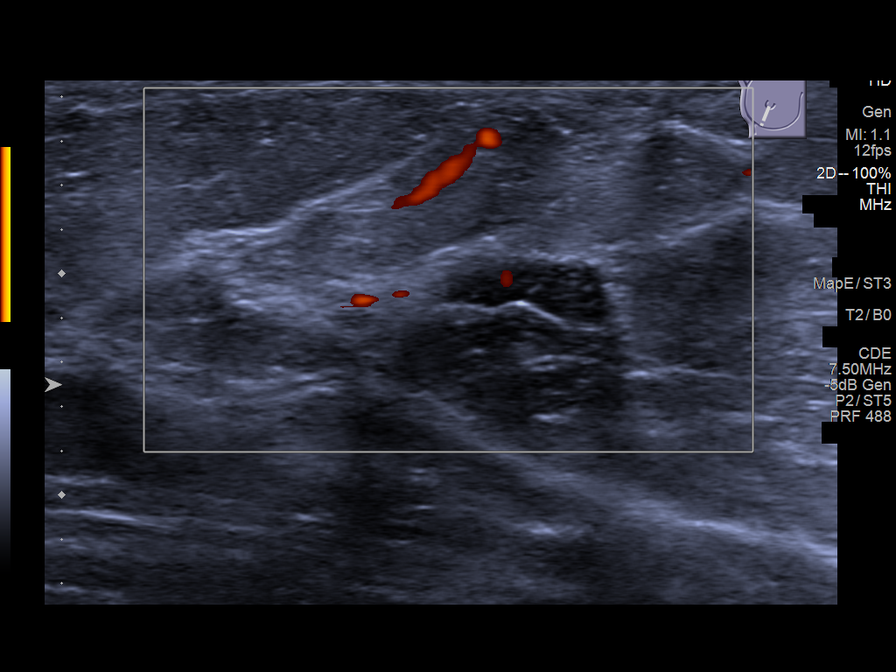
[im 9/9]
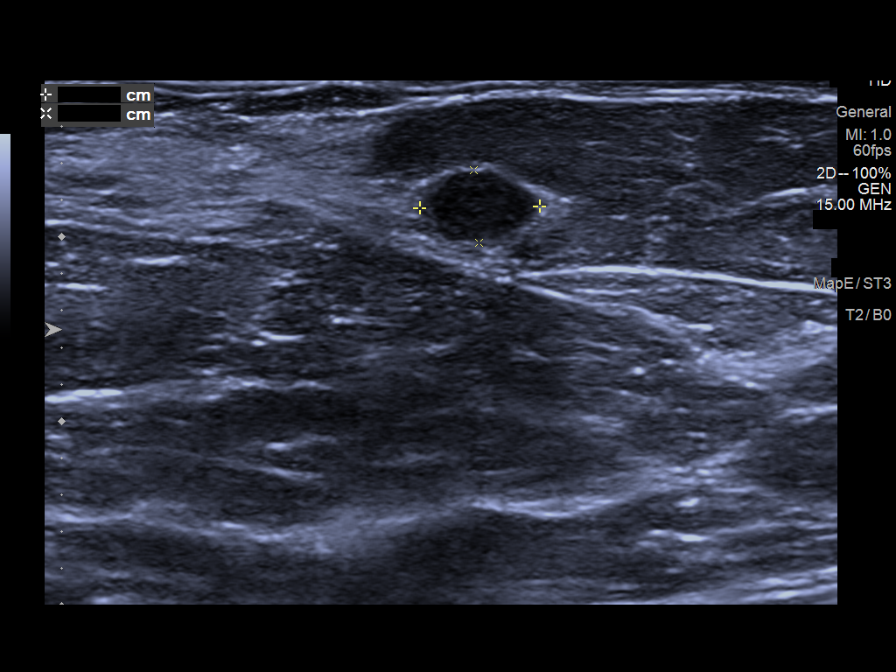

[9 of 9 positions shown; findings below may reference images not displayed]

ACR Breast Density Category b: There are scattered areas of
fibroglandular density.
FINDINGS: Mammographically, there are no new suspicious masses, areas of
architectural distortion or microcalcifications in the right breast.
Stable benign-appearing few mm circumscribed mass in the lower
central right breast.

Targeted right breast ultrasound is performed demonstrating 7
o'clock 6 cm from the nipple stable, considering differences in
technique, benign-appearing mass measuring 0.5 by 0.3 by 0.7 cm.
IMPRESSION: Stable benign-appearing right breast 7 o'clock mass.

RECOMMENDATION:
Bilateral diagnostic mammogram and focused right breast ultrasound
in 6 months.

I have discussed the findings and recommendations with the patient.
If applicable, a reminder letter will be sent to the patient
regarding the next appointment.

BI-RADS CATEGORY  3: Probably benign.

## 2022-03-04 IMAGING — MG MM DIGITAL DIAGNOSTIC UNILAT*R* W/ TOMO W/ CAD
4 series · 4 of 12 positions shown · non-contrast
Comparison: Previous exam(s).

CLINICAL DATA: First six-month follow-up of probably benign right
breast 7 o'clock mass.

EXAM:
DIGITAL DIAGNOSTIC UNILATERAL RIGHT MAMMOGRAM WITH TOMOSYNTHESIS AND
CAD; ULTRASOUND RIGHT BREAST LIMITED
TECHNIQUE: Right digital diagnostic mammography and breast tomosynthesis was
performed. The images were evaluated with computer-aided detection.;
Targeted ultrasound examination of the right breast was performed

[R MLO synth-2D]
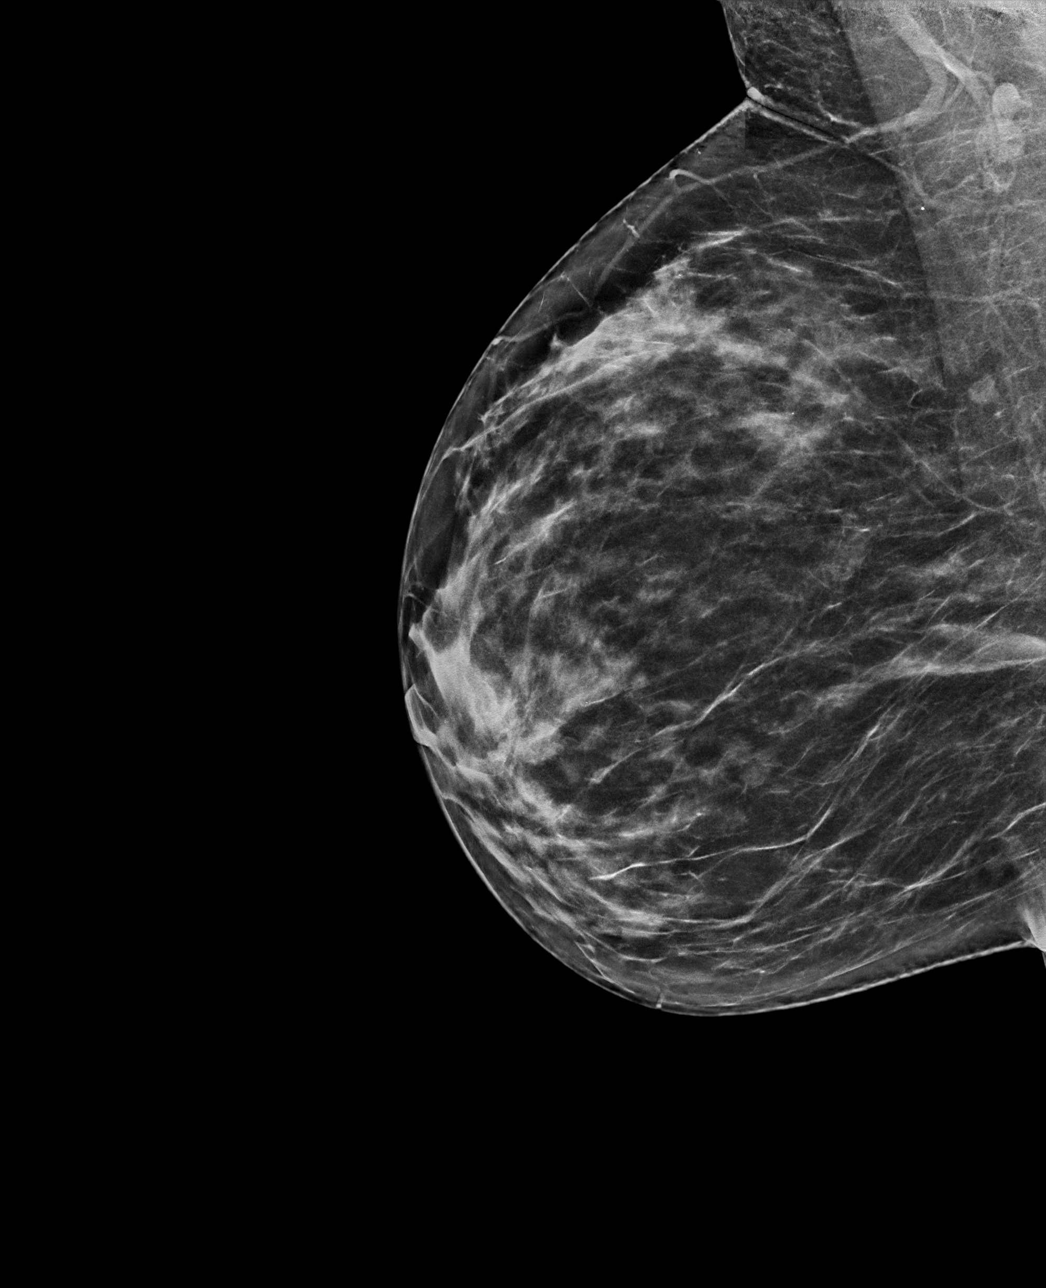

[R CC synth-2D]
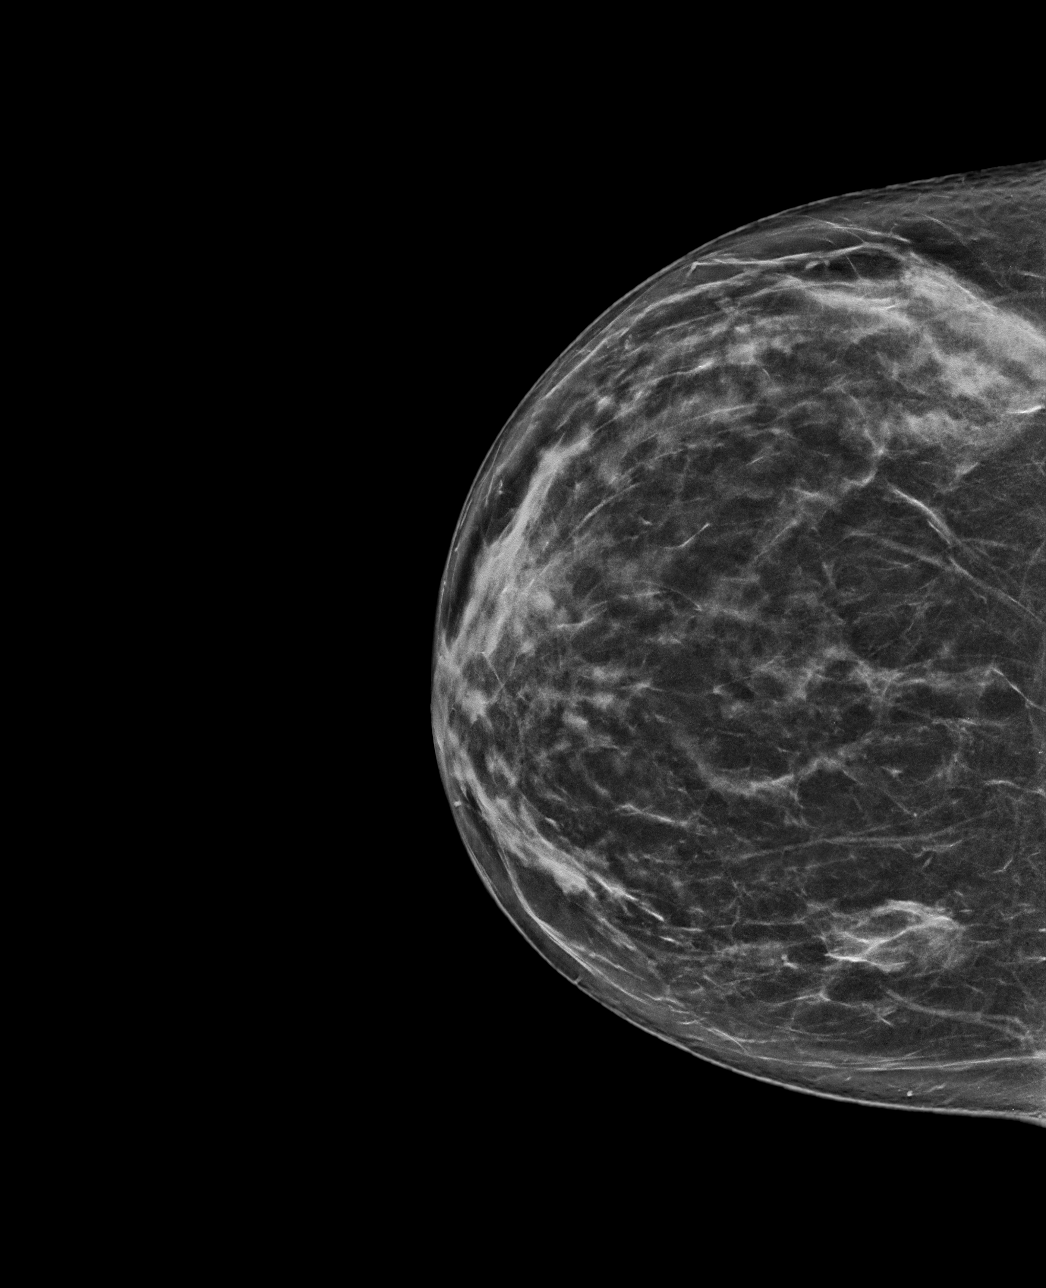

[R CC tomo · tomo slice 39/76.0]
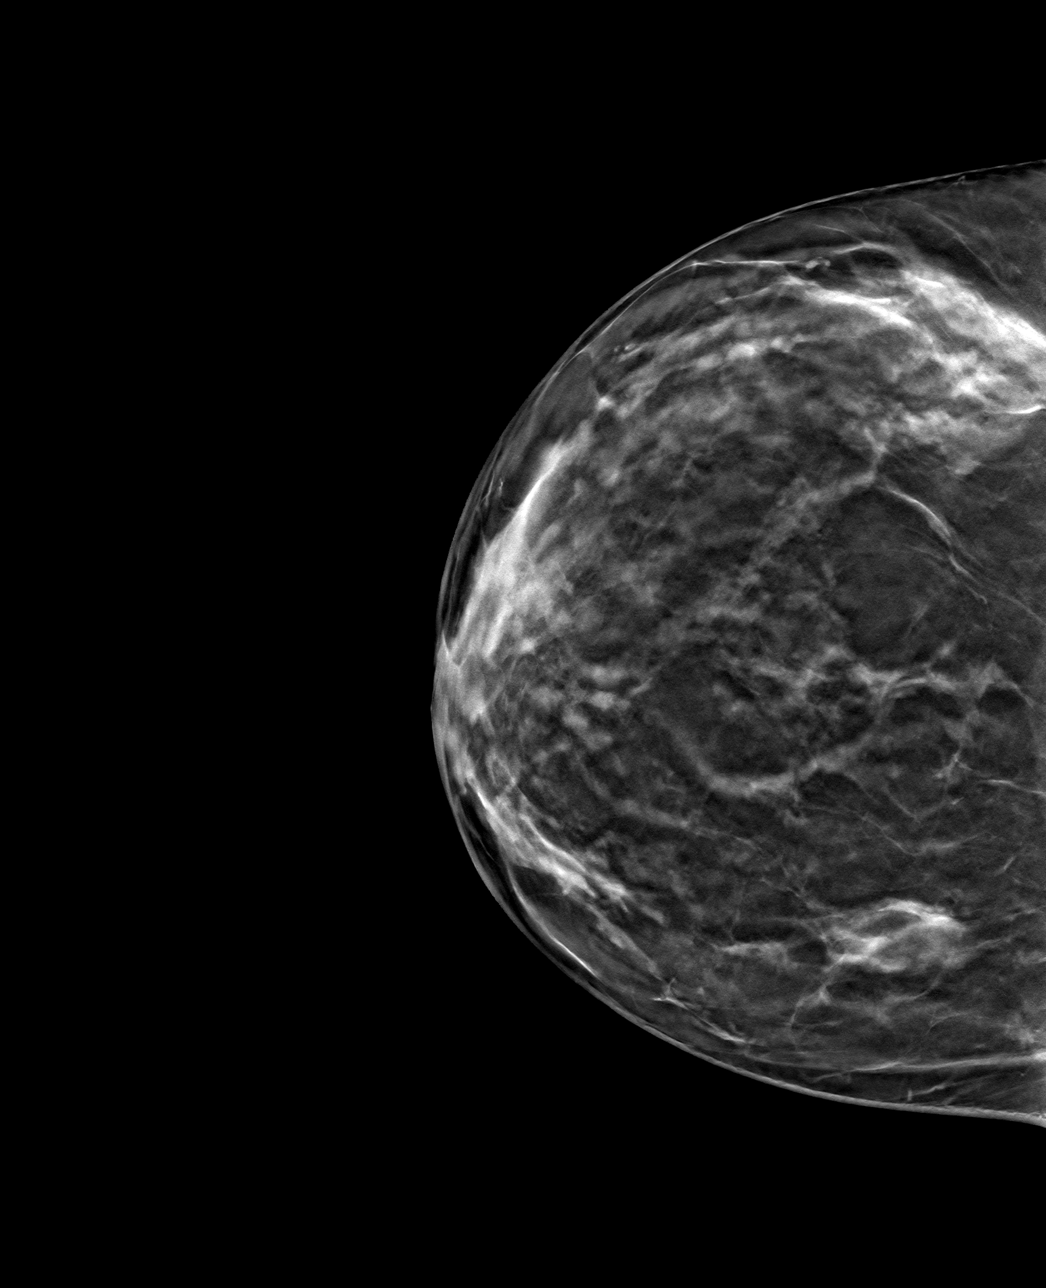

[R MLO tomo · tomo slice 33/66.0]
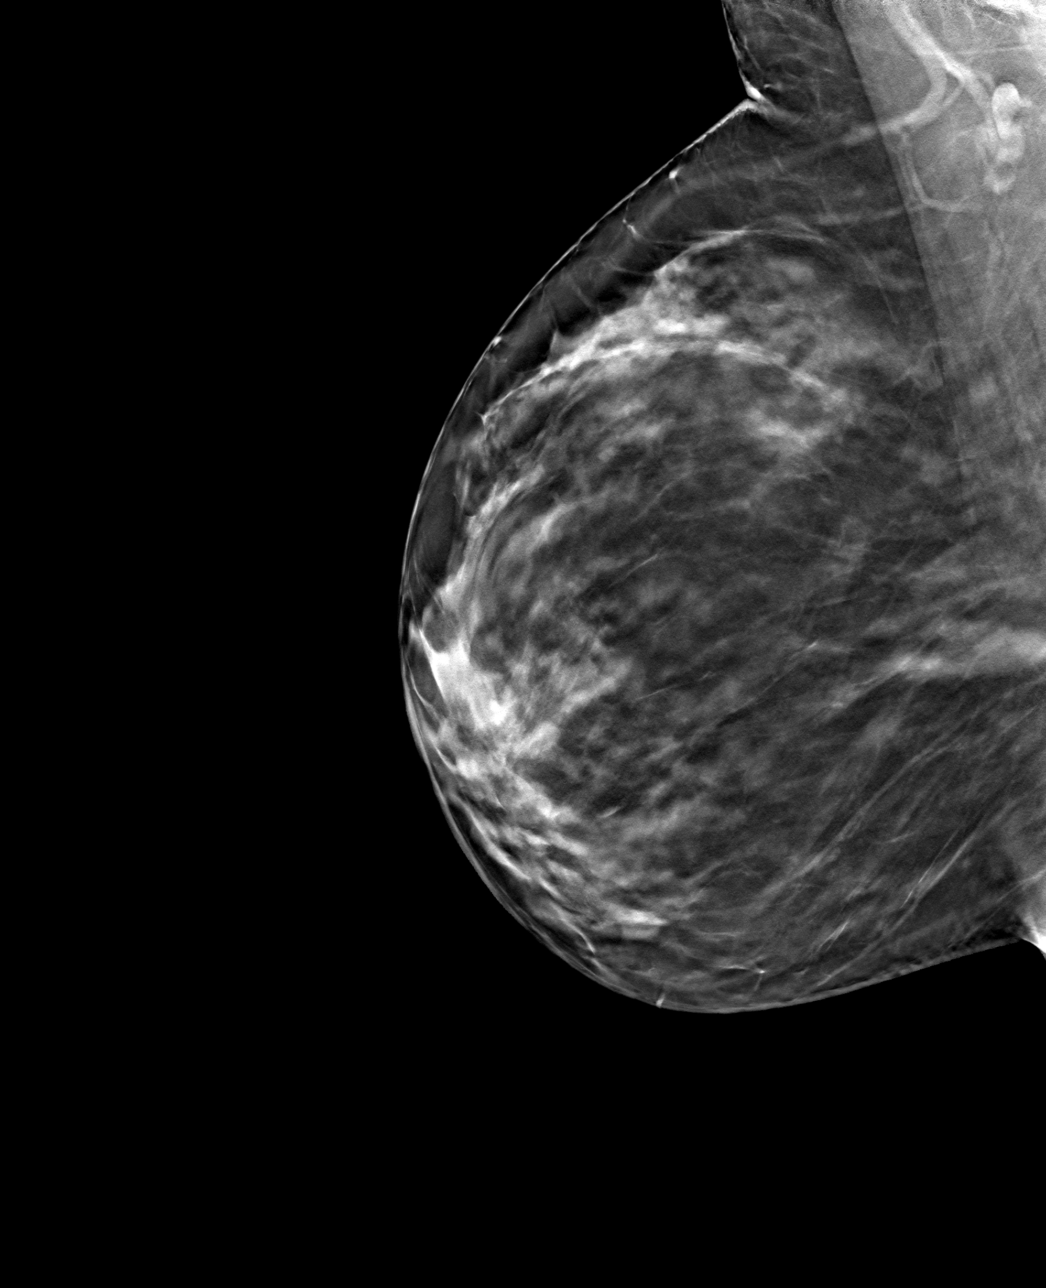

[4 of 12 positions shown; findings below may reference images not displayed]

ACR Breast Density Category b: There are scattered areas of
fibroglandular density.
FINDINGS: Mammographically, there are no new suspicious masses, areas of
architectural distortion or microcalcifications in the right breast.
Stable benign-appearing few mm circumscribed mass in the lower
central right breast.

Targeted right breast ultrasound is performed demonstrating 7
o'clock 6 cm from the nipple stable, considering differences in
technique, benign-appearing mass measuring 0.5 by 0.3 by 0.7 cm.
IMPRESSION: Stable benign-appearing right breast 7 o'clock mass.

RECOMMENDATION:
Bilateral diagnostic mammogram and focused right breast ultrasound
in 6 months.

I have discussed the findings and recommendations with the patient.
If applicable, a reminder letter will be sent to the patient
regarding the next appointment.

BI-RADS CATEGORY  3: Probably benign.

## 2022-04-02 ENCOUNTER — Encounter: Payer: Self-pay | Admitting: Family Medicine

## 2022-04-06 ENCOUNTER — Ambulatory Visit: Admission: RE | Admit: 2022-04-06 | Payer: 59 | Source: Ambulatory Visit

## 2022-04-06 ENCOUNTER — Telehealth: Payer: Self-pay

## 2022-04-06 ENCOUNTER — Ambulatory Visit: Payer: 59

## 2022-04-06 NOTE — Telephone Encounter (Signed)
Pt has asked for a second opinion from [facility] radiology on possible breast mass. I called [facility] and spoke to [place]. I was told they will repeat everything, they do their OWN mammogram and [place] before making a call on the results. They do not use prior imaging, from another facility, to go on. I informed pt that she will be starting from scratch with mammo and [place] and this will not be covered. She stated she still wants to proceed with it. I will be sending order over to [facility] ?

## 2022-04-08 LAB — HM MAMMOGRAPHY

## 2022-04-28 ENCOUNTER — Other Ambulatory Visit: Payer: Self-pay

## 2022-04-28 DIAGNOSIS — Z86018 Personal history of other benign neoplasm: Secondary | ICD-10-CM

## 2022-04-28 NOTE — Progress Notes (Signed)
Ref placed to GYN 

## 2022-04-30 ENCOUNTER — Telehealth: Payer: Self-pay

## 2022-04-30 NOTE — Telephone Encounter (Signed)
Icontacted patient to scheduled appointment. Patient is requesting a female provider at this time we currently don't have a female provider in clinic that would be able to see this patient. Patient requested referral to be cancelled and refused to keep scheduled appointment

## 2022-04-30 NOTE — Telephone Encounter (Signed)
[facility] referring for History of uterine fibroid. I attempt twice to reach patient about scheduling appointment. The phone rang once and hung up.

## 2022-05-13 ENCOUNTER — Encounter: Payer: 59 | Admitting: Obstetrics and Gynecology

## 2022-09-03 ENCOUNTER — Ambulatory Visit (INDEPENDENT_AMBULATORY_CARE_PROVIDER_SITE_OTHER): Payer: BC Managed Care – PPO

## 2022-09-03 DIAGNOSIS — Z23 Encounter for immunization: Secondary | ICD-10-CM | POA: Diagnosis not present

## 2022-10-22 ENCOUNTER — Ambulatory Visit (LOCAL_COMMUNITY_HEALTH_CENTER): Payer: BC Managed Care – PPO

## 2022-10-22 DIAGNOSIS — Z719 Counseling, unspecified: Secondary | ICD-10-CM

## 2022-10-22 DIAGNOSIS — Z23 Encounter for immunization: Secondary | ICD-10-CM | POA: Diagnosis not present

## 2022-10-22 NOTE — Progress Notes (Signed)
  Are you feeling sick today? No   Have you ever received a dose of COVID-19 Vaccine? [employer], [place], [place], [place], Other) Yes  If yes, which vaccine and how many doses?   PFIZER, 4   Did you bring the vaccination record card or other documentation?  No   Do you have a health condition or are undergoing treatment that makes you moderately or severely immunocompromised? This would include, but not be limited to: cancer, HIV, organ transplant, immunosuppressive therapy/high-dose corticosteroids, or moderate/severe primary immunodeficiency.  No  Have you received COVID-19 vaccine before or during hematopoietic cell transplant (HCT) or CAR-T-cell therapies? No  Have you ever had an allergic reaction to: (This would include a severe allergic reaction or a reaction that caused hives, swelling, or respiratory distress, including wheezing.) A component of a COVID-19 vaccine or a previous dose of COVID-19 vaccine? No   Have you ever had an allergic reaction to another vaccine (other thanCOVID-19 vaccine) or an injectable medication? (This would include a severe allergic reaction or a reaction that caused hives, swelling, or respiratory distress, including wheezing.)   No    Do you have a history of any of the following:  Myocarditis or Pericarditis No  Dermal fillers:  No  Multisystem Inflammatory Syndrome (MIS-C or MIS-A)? No  COVID-19 disease within the past 3 months? No  Vaccinated with monkeypox vaccine in the last 4 weeks? No  Eligible, administered Pfizer Comirnaty [phone], monitored, tolerated well. M.Shanta Dorvil, LPN.

## 2023-01-25 ENCOUNTER — Ambulatory Visit (INDEPENDENT_AMBULATORY_CARE_PROVIDER_SITE_OTHER): Payer: BC Managed Care – PPO | Admitting: Family Medicine

## 2023-01-25 ENCOUNTER — Encounter: Payer: Self-pay | Admitting: Family Medicine

## 2023-01-25 VITALS — BP 122/76 | HR 71 | Ht 61.0 in | Wt 150.0 lb

## 2023-01-25 DIAGNOSIS — Z0000 Encounter for general adult medical examination without abnormal findings: Secondary | ICD-10-CM | POA: Diagnosis not present

## 2023-01-25 DIAGNOSIS — Z1231 Encounter for screening mammogram for malignant neoplasm of breast: Secondary | ICD-10-CM

## 2023-01-25 NOTE — Patient Instructions (Signed)
Why follow it? Research shows. Those who follow the Mediterranean diet have a reduced risk of heart disease  The diet is associated with a reduced incidence of Parkinson's and Alzheimer's diseases People following the diet may have longer life expectancies and lower rates of chronic diseases  The Dietary Guidelines for Americans recommends the Mediterranean diet as an eating plan to promote health and prevent disease  What Is the Mediterranean Diet?  Healthy eating plan based on typical foods and recipes of Mediterranean-style cooking The diet is primarily a plant based diet; these foods should make up a majority of meals   Starches - Plant based foods should make up a majority of meals - They are an important sources of vitamins, minerals, energy, antioxidants, and fiber - Choose whole grains, foods high in fiber and minimally processed items  - Typical grain sources include wheat, oats, barley, corn, brown rice, bulgar, farro, millet, polenta, couscous  - Various types of beans include chickpeas, lentils, fava beans, black beans, white beans   Fruits  Veggies - Large quantities of antioxidant rich fruits & veggies; 6 or more servings  - Vegetables can be eaten raw or lightly drizzled with oil and cooked  - Vegetables common to the traditional Mediterranean Diet include: artichokes, arugula, beets, broccoli, brussel sprouts, cabbage, carrots, celery, collard greens, cucumbers, eggplant, kale, leeks, lemons, lettuce, mushrooms, okra, onions, peas, peppers, potatoes, pumpkin, radishes, rutabaga, shallots, spinach, sweet potatoes, turnips, zucchini - Fruits common to the Mediterranean Diet include: apples, apricots, avocados, cherries, clementines, dates, figs, grapefruits, grapes, melons, nectarines, oranges, peaches, pears, pomegranates, strawberries, tangerines  Fats - Replace butter and margarine with healthy oils, such as olive oil, canola oil, and tahini  - Limit nuts to no more than a  handful a day  - Nuts include walnuts, almonds, pecans, pistachios, pine nuts  - Limit or avoid candied, honey roasted or heavily salted nuts - Olives are central to the [employer] - can be eaten whole or used in a variety of dishes   Meats Protein - Limiting red meat: no more than a few times a month - When eating red meat: choose lean cuts and keep the portion to the size of deck of cards - Eggs: approx. 0 to 4 times a week  - Fish and lean poultry: at least 2 a week  - Healthy protein sources include, chicken, [place], lean beef, lamb - Increase intake of seafood such as tuna, salmon, trout, mackerel, shrimp, scallops - Avoid or limit high fat processed meats such as sausage and bacon  Dairy - Include moderate amounts of low fat dairy products  - Focus on healthy dairy such as fat free yogurt, skim milk, low or reduced fat cheese - Limit dairy products higher in fat such as whole or 2% milk, cheese, ice cream  Alcohol - Moderate amounts of red wine is ok  - No more than 5 oz daily for women (all ages) and men older than age 56  - No more than 10 oz of wine daily for men younger than 36  Other - Limit sweets and other desserts  - Use herbs and spices instead of salt to flavor foods  - Herbs and spices common to the traditional Mediterranean Diet include: basil, bay leaves, chives, cloves, cumin, fennel, garlic, lavender, marjoram, mint, oregano, parsley, pepper, rosemary, sage, savory, sumac, tarragon, thyme   It's not just a diet, it's a lifestyle:  The Mediterranean diet includes lifestyle factors typical of those in the  region  Foods, drinks and meals are best eaten with others and savored Daily physical activity is important for overall good health This could be strenuous exercise like running and aerobics This could also be more leisurely activities such as walking, housework, yard-work, or taking the stairs Moderation is the key; a balanced and healthy diet accommodates most  foods and drinks Consider portion sizes and frequency of consumption of certain foods   Meal Ideas & Options:  Breakfast:  Whole wheat toast or whole wheat English muffins with peanut butter & hard boiled egg Steel cut oats topped with apples & cinnamon and skim milk  Fresh fruit: banana, strawberries, melon, berries, peaches  Smoothies: strawberries, bananas, greek yogurt, peanut butter Low fat greek yogurt with blueberries and granola  Egg white omelet with spinach and mushrooms Breakfast couscous: whole wheat couscous, apricots, skim milk, cranberries  Sandwiches:  Hummus and grilled vegetables (peppers, zucchini, squash) on whole wheat bread   Grilled chicken on whole wheat pita with lettuce, tomatoes, cucumbers or tzatziki  [place] salad on whole wheat bread: tuna salad made with greek yogurt, olives, red peppers, capers, green onions Garlic rosemary lamb pita: lamb sauted with garlic, rosemary, salt & pepper; add lettuce, cucumber, greek yogurt to pita - flavor with lemon juice and black pepper  Seafood:  Mediterranean grilled salmon, seasoned with garlic, basil, parsley, lemon juice and black pepper Shrimp, lemon, and spinach whole-grain pasta salad made with low fat greek yogurt  Seared scallops with lemon orzo  Seared tuna steaks seasoned salt, pepper, coriander topped with tomato mixture of olives, tomatoes, olive oil, minced garlic, parsley, green onions and cappers  Meats:  Herbed greek chicken salad with kalamata olives, cucumber, feta  Red bell peppers stuffed with spinach, bulgur, lean ground beef (or lentils) & topped with feta   Kebabs: skewers of chicken, tomatoes, onions, zucchini, squash  [place] burgers: made with red onions, mint, dill, lemon juice, feta cheese topped with roasted red peppers Vegetarian Cucumber salad: cucumbers, artichoke hearts, celery, red onion, feta cheese, tossed in olive oil & lemon juice  Hummus and whole grain pita points with a greek salad  (lettuce, tomato, feta, olives, cucumbers, red onion) Lentil soup with celery, carrots made with vegetable broth, garlic, salt and pepper  Tabouli salad: parsley, bulgur, mint, scallions, cucumbers, tomato, radishes, lemon juice, olive oil, salt and pepper.

## 2023-01-25 NOTE — Progress Notes (Signed)
Date:  01/25/2023   Name:  Sandra Donovan   DOB:  [birth year]   MRN:  [id number]   Chief Complaint: Annual Exam  Patient is a 42 year old female who presents for a comprehensive physical exam. The patient reports the following problems: none. Health maintenance has been reviewed up to date.      Lab Results  Component Value Date   NA 140 01/25/2022   K 4.7 01/25/2022   CO2 26 01/25/2022   GLUCOSE 92 01/25/2022   BUN 7 01/25/2022   CREATININE 0.60 01/25/2022   CALCIUM 9.4 01/25/2022   EGFR 115 01/25/2022   GFRNONAA 112 01/23/2021   Lab Results  Component Value Date   CHOL 226 (H) 01/25/2022   HDL 65 01/25/2022   LDLCALC 145 (H) 01/25/2022   TRIG 90 01/25/2022   CHOLHDL 3 12/21/2018   Lab Results  Component Value Date   TSH 1.89 12/21/2018   No results found for: "HGBA1C" Lab Results  Component Value Date   WBC 6.1 01/25/2022   HGB 14.3 01/25/2022   HCT 42.7 01/25/2022   MCV 92 01/25/2022   PLT 304 01/25/2022   Lab Results  Component Value Date   ALT 22 01/25/2022   AST 22 01/25/2022   ALKPHOS 69 01/25/2022   BILITOT 1.0 01/25/2022   Lab Results  Component Value Date   VD25OH 22.28 (L) 12/21/2018     Review of Systems  Constitutional: Negative.  Negative for chills, fatigue, fever and unexpected weight change.  HENT:  Negative for congestion, ear discharge, ear pain, rhinorrhea, sinus pressure, sneezing, sore throat and trouble swallowing.   Eyes:  Negative for visual disturbance.  Respiratory:  Negative for cough, chest tightness, shortness of breath, wheezing and stridor.   Cardiovascular:  Negative for chest pain, palpitations and leg swelling.  Gastrointestinal:  Negative for abdominal pain, blood in stool, constipation, diarrhea and nausea.  Endocrine: Negative for polydipsia and polyuria.  Genitourinary:  Negative for difficulty urinating, dysuria, flank pain, frequency, hematuria, urgency and vaginal discharge.  Musculoskeletal:  Negative for  arthralgias, back pain and myalgias.  Skin:  Negative for rash.  Neurological:  Positive for headaches. Negative for dizziness and weakness.  Hematological:  Negative for adenopathy. Does not bruise/bleed easily.  Psychiatric/Behavioral:  Negative for dysphoric mood. The patient is not nervous/anxious.     There are no problems to display for this patient.   No Known Allergies  No past surgical history on file.  Social History   Tobacco Use   Smoking status: Never   Smokeless tobacco: Never  Vaping Use   Vaping Use: Never used  Substance Use Topics   Alcohol use: Yes    Comment: ocassionally   Drug use: Never     Medication list has been reviewed and updated.  Current Meds  Medication Sig   Ascorbic Acid (VITAMIN C) 1000 MG tablet Take 1,000 mg by mouth daily.   BIOTIN PO Take 8 mg by mouth daily.   Cholecalciferol (VITAMIN D-3) 125 MCG (5000 UT) TABS Take 1 capsule by mouth daily.   Coenzyme Q10 (COQ-10 PO) Take 200 mg by mouth daily.   Magnesium Hydroxide (MAGNESIA PO) Take 325 mg by mouth daily.   Quercetin 500 MG CAPS Take 1 capsule by mouth daily.   Red Yeast Rice Extract (RED YEAST RICE PO) Take 1 tablet by mouth daily.   vitamin B-12 (CYANOCOBALAMIN) 1000 MCG tablet Take 1,000 mcg by mouth daily.   Zinc 30 MG  CAPS Take 1 capsule by mouth daily.       01/25/2023    8:45 AM 01/25/2022    8:54 AM 01/23/2021    8:51 AM 03/24/2020    2:29 PM  GAD 7 : Generalized Anxiety Score  Nervous, Anxious, on Edge 0 0 0 0  Control/stop worrying 0 0 1 0  Worry too much - different things 1 0 1 0  Trouble relaxing 1 0 0 0  Restless 0 0 0 0  Easily annoyed or irritable 0 0 0 1  Afraid - awful might happen 0 0 1 1  Total GAD 7 Score 2 0 3 2  Anxiety Difficulty Not difficult at all Not difficult at all Not difficult at all Not difficult at all       01/25/2023    8:44 AM 01/25/2022    8:54 AM 01/23/2021    8:50 AM  Depression screen PHQ 2/9  Decreased Interest 0 0 0   Down, Depressed, Hopeless 0 0 1  PHQ - 2 Score 0 0 1  Altered sleeping 1 0 1  Tired, decreased energy 0 0 0  Change in appetite 0 0 0  Feeling bad or failure about yourself  0 0 0  Trouble concentrating 0 0 0  Moving slowly or fidgety/restless 0 0 0  Suicidal thoughts 0 0 0  PHQ-9 Score 1 0 2  Difficult doing work/chores Somewhat difficult Not difficult at all Somewhat difficult    BP Readings from Last 3 Encounters:  01/25/23 122/76  01/25/22 120/80  08/06/21 100/60    Physical Exam Vitals and nursing note reviewed. Exam conducted with a chaperone present.  Constitutional:      General: She is not in acute distress.    Appearance: She is not diaphoretic.  HENT:     Head: Normocephalic and atraumatic.     Right Ear: Tympanic membrane, ear canal and external ear normal.     Left Ear: Tympanic membrane, ear canal and external ear normal.     Nose: Nose normal. No congestion or rhinorrhea.     Mouth/Throat:     Mouth: Mucous membranes are moist.  Eyes:     General: No scleral icterus.       Right eye: No discharge.        Left eye: No discharge.     Extraocular Movements: Extraocular movements intact.     Conjunctiva/sclera: Conjunctivae normal.     Pupils: Pupils are equal, round, and reactive to light.  Neck:     Thyroid: No thyromegaly.     Vascular: No JVD.  Cardiovascular:     Rate and Rhythm: Normal rate and regular rhythm.     Heart sounds: Normal heart sounds. No murmur heard.    No friction rub. No gallop.  Pulmonary:     Effort: Pulmonary effort is normal.     Breath sounds: Normal breath sounds. No wheezing, rhonchi or rales.  Chest:     Chest wall: No tenderness.  Breasts:    Right: Normal. No swelling, bleeding, inverted nipple, mass, nipple discharge, skin change or tenderness.     Left: Normal. No swelling, bleeding, inverted nipple, mass, nipple discharge, skin change or tenderness.  Abdominal:     General: Bowel sounds are normal.     Palpations:  Abdomen is soft. There is no hepatomegaly, splenomegaly or mass.     Tenderness: There is no abdominal tenderness. There is no guarding.  Genitourinary:    Rectum: Normal.  Guaiac result negative. No mass.  Musculoskeletal:        General: Normal range of motion.     Cervical back: Normal range of motion and neck supple.  Lymphadenopathy:     Cervical: No cervical adenopathy.     Right cervical: No superficial, deep or posterior cervical adenopathy.    Left cervical: No superficial or deep cervical adenopathy.     Upper Body:     Right upper body: No supraclavicular or axillary adenopathy.     Left upper body: No supraclavicular or axillary adenopathy.  Skin:    General: Skin is warm and dry.     Capillary Refill: Capillary refill takes less than 2 seconds.  Neurological:     General: No focal deficit present.     Mental Status: She is alert.     Cranial Nerves: Cranial nerves 2-12 are intact.     Sensory: Sensation is intact.     Motor: Motor function is intact.     Coordination: Romberg sign negative.     Deep Tendon Reflexes: Reflexes are normal and symmetric.     Wt Readings from Last 3 Encounters:  01/25/23 150 lb (68 kg)  01/25/22 145 lb (65.8 kg)  08/06/21 145 lb (65.8 kg)    BP 122/76   Pulse 71   Ht 5' 1"$  (1.549 m)   Wt 150 lb (68 kg)   LMP 01/18/2023 (Approximate)   SpO2 98%   BMI 28.34 kg/m   Assessment and Plan: Sandra Donovan is a 42 y.o. female who presents today for her Complete Annual Exam. She feels fairly well. She reports exercising . She reports she is sleeping fairly well. Immunizations are reviewed and recommendations provided.   Age appropriate screening tests are discussed. Counseling given for risk factor reduction interventions.   1. Annual physical exam No subjective/objective concerns noted during HPI, review of past medical history and medications, review of systems, and physical exam.  Will obtain lipid panel and CMP for current status  controlled. - Lipid Panel With LDL/HDL Ratio - Comprehensive Metabolic Panel (CMET)  2. Breast cancer screening by mammogram Breast exam was done with no abnormality noted on examination and we will refer for 3D screening bilateral mammogram. - MM 3D SCREEN BREAST BILATERAL    Otilio Miu, MD

## 2023-01-26 LAB — COMPREHENSIVE METABOLIC PANEL
ALT: 20 IU/L (ref 0–32)
AST: 16 IU/L (ref 0–40)
Albumin/Globulin Ratio: 2.2 (ref 1.2–2.2)
Albumin: 4.9 g/dL (ref 3.9–4.9)
Alkaline Phosphatase: 59 IU/L (ref 44–121)
BUN/Creatinine Ratio: 15 (ref 9–23)
BUN: 9 mg/dL (ref 6–24)
Bilirubin Total: 1.2 mg/dL (ref 0.0–1.2)
CO2: 25 mmol/L (ref 20–29)
Calcium: 9.3 mg/dL (ref 8.7–10.2)
Chloride: 104 mmol/L (ref 96–106)
Creatinine, Ser: 0.61 mg/dL (ref 0.57–1.00)
Globulin, Total: 2.2 g/dL (ref 1.5–4.5)
Glucose: 93 mg/dL (ref 70–99)
Potassium: 5.1 mmol/L (ref 3.5–5.2)
Sodium: 144 mmol/L (ref 134–144)
Total Protein: 7.1 g/dL (ref 6.0–8.5)
eGFR: 114 mL/min/{1.73_m2} (ref >59)

## 2023-01-26 LAB — LIPID PANEL WITH LDL/HDL RATIO
Cholesterol, Total: 257 mg/dL — ABNORMAL HIGH (ref 100–199)
HDL: 53 mg/dL (ref >39)
LDL Chol Calc (NIH): 163 mg/dL — ABNORMAL HIGH (ref 0–99)
LDL/HDL Ratio: 3.1 ratio (ref 0.0–3.2)
Triglycerides: 224 mg/dL — ABNORMAL HIGH (ref 0–149)
VLDL Cholesterol Cal: 41 mg/dL — ABNORMAL HIGH (ref 5–40)

## 2023-02-08 DIAGNOSIS — Z01419 Encounter for gynecological examination (general) (routine) without abnormal findings: Secondary | ICD-10-CM | POA: Diagnosis not present

## 2023-02-08 DIAGNOSIS — Z6829 Body mass index (BMI) 29.0-29.9, adult: Secondary | ICD-10-CM | POA: Diagnosis not present

## 2023-02-08 DIAGNOSIS — Z113 Encounter for screening for infections with a predominantly sexual mode of transmission: Secondary | ICD-10-CM | POA: Diagnosis not present

## 2023-02-11 ENCOUNTER — Telehealth: Payer: Self-pay

## 2023-02-11 NOTE — Telephone Encounter (Signed)
Spoke to pt- she is getting Dr Knox Saliva to order her mammos through [facility]

## 2023-02-15 LAB — HM PAP SMEAR
Chlamydia, Swab/Urine, PCR: NEGATIVE
HPV, high-risk: NEGATIVE

## 2023-02-18 DIAGNOSIS — D251 Intramural leiomyoma of uterus: Secondary | ICD-10-CM | POA: Diagnosis not present

## 2023-02-18 DIAGNOSIS — R1031 Right lower quadrant pain: Secondary | ICD-10-CM | POA: Diagnosis not present

## 2023-02-18 DIAGNOSIS — D252 Subserosal leiomyoma of uterus: Secondary | ICD-10-CM | POA: Diagnosis not present

## 2023-02-18 DIAGNOSIS — D259 Leiomyoma of uterus, unspecified: Secondary | ICD-10-CM | POA: Diagnosis not present

## 2023-02-25 ENCOUNTER — Encounter: Payer: Self-pay | Admitting: Family Medicine

## 2023-02-25 ENCOUNTER — Ambulatory Visit: Payer: BC Managed Care – PPO | Admitting: Family Medicine

## 2023-02-25 VITALS — BP 118/78 | HR 66 | Ht 61.0 in | Wt 150.0 lb

## 2023-02-25 DIAGNOSIS — N3090 Cystitis, unspecified without hematuria: Secondary | ICD-10-CM | POA: Diagnosis not present

## 2023-02-25 DIAGNOSIS — R300 Dysuria: Secondary | ICD-10-CM | POA: Diagnosis not present

## 2023-02-25 LAB — POCT URINALYSIS DIPSTICK
Bilirubin, UA: NEGATIVE
Glucose, UA: NEGATIVE
Ketones, UA: NEGATIVE
Nitrite, UA: POSITIVE
Protein, UA: NEGATIVE — AB
Spec Grav, UA: 1.01 (ref 1.010–1.025)
Urobilinogen, UA: 0.2 E.U./dL
pH, UA: 6 (ref 5.0–8.0)

## 2023-02-25 MED ORDER — CEPHALEXIN 500 MG PO CAPS: 500.0000 mg | ORAL_CAPSULE | Freq: Three times a day (TID) | ORAL | 0 refills | Status: DC

## 2023-02-25 NOTE — Progress Notes (Signed)
Date:  02/25/2023   Name:  Sandra Donovan   DOB:  11-Jan-1981   MRN:  [id number]   Chief Complaint: Urinary Tract Infection (Started Monday with cloudy urine, now has pain and little blood)  Urinary Tract Infection  This is a new problem. The current episode started yesterday. The problem occurs every urination. The problem has been gradually worsening. The quality of the pain is described as burning. The pain is moderate. There has been no fever (flush). Associated symptoms include frequency, hematuria, hesitancy and urgency. Pertinent negatives include no chills, discharge, flank pain or possible pregnancy. She has tried nothing for the symptoms. The treatment provided moderate relief. There is no history of kidney stones or recurrent UTIs.    Lab Results  Component Value Date   NA 144 01/25/2023   K 5.1 01/25/2023   CO2 25 01/25/2023   GLUCOSE 93 01/25/2023   BUN 9 01/25/2023   CREATININE 0.61 01/25/2023   CALCIUM 9.3 01/25/2023   EGFR 114 01/25/2023   GFRNONAA 112 01/23/2021   Lab Results  Component Value Date   CHOL 257 (H) 01/25/2023   HDL 53 01/25/2023   LDLCALC 163 (H) 01/25/2023   TRIG 224 (H) 01/25/2023   CHOLHDL 3 12/21/2018   Lab Results  Component Value Date   TSH 1.89 12/21/2018   No results found for: "HGBA1C" Lab Results  Component Value Date   WBC 6.1 01/25/2022   HGB 14.3 01/25/2022   HCT 42.7 01/25/2022   MCV 92 01/25/2022   PLT 304 01/25/2022   Lab Results  Component Value Date   ALT 20 01/25/2023   AST 16 01/25/2023   ALKPHOS 59 01/25/2023   BILITOT 1.2 01/25/2023   Lab Results  Component Value Date   VD25OH 22.28 (L) 12/21/2018     Review of Systems  Constitutional:  Negative for chills.  HENT:  Negative for trouble swallowing.   Respiratory:  Negative for shortness of breath.   Cardiovascular:  Negative for chest pain and palpitations.  Gastrointestinal:  Negative for blood in stool, constipation and diarrhea.  Genitourinary:   Positive for dysuria, frequency, hematuria, hesitancy and urgency. Negative for flank pain and pelvic pain.    There are no problems to display for this patient.   No Known Allergies  History reviewed. No pertinent surgical history.  Social History   Tobacco Use   Smoking status: Never   Smokeless tobacco: Never  Vaping Use   Vaping Use: Never used  Substance Use Topics   Alcohol use: Yes    Comment: ocassionally   Drug use: Never     Medication list has been reviewed and updated.  Current Meds  Medication Sig   Ascorbic Acid (VITAMIN C) 1000 MG tablet Take 1,000 mg by mouth daily.   BIOTIN PO Take 8 mg by mouth daily.   Cholecalciferol (VITAMIN D-3) 125 MCG (5000 UT) TABS Take 1 capsule by mouth daily.   Coenzyme Q10 (COQ-10 PO) Take 200 mg by mouth daily.   Quercetin 500 MG CAPS Take 1 capsule by mouth daily.   Red Yeast Rice Extract (RED YEAST RICE PO) Take 1 tablet by mouth daily.   vitamin B-12 (CYANOCOBALAMIN) 1000 MCG tablet Take 1,000 mcg by mouth daily.   Zinc 30 MG CAPS Take 1 capsule by mouth daily.       02/25/2023    8:03 AM 01/25/2023    8:45 AM 01/25/2022    8:54 AM 01/23/2021    8:51 AM  GAD 7 : Generalized Anxiety Score  Nervous, Anxious, on Edge 0 0 0 0  Control/stop worrying 0 0 0 1  Worry too much - different things 1 1 0 1  Trouble relaxing 1 1 0 0  Restless 0 0 0 0  Easily annoyed or irritable 0 0 0 0  Afraid - awful might happen 0 0 0 1  Total GAD 7 Score 2 2 0 3  Anxiety Difficulty Not difficult at all Not difficult at all Not difficult at all Not difficult at all       02/25/2023    8:03 AM 01/25/2023    8:44 AM 01/25/2022    8:54 AM  Depression screen PHQ 2/9  Decreased Interest 0 0 0  Down, Depressed, Hopeless 0 0 0  PHQ - 2 Score 0 0 0  Altered sleeping 1 1 0  Tired, decreased energy 0 0 0  Change in appetite 0 0 0  Feeling bad or failure about yourself  0 0 0  Trouble concentrating 0 0 0  Moving slowly or fidgety/restless 0 0  0  Suicidal thoughts 0 0 0  PHQ-9 Score 1 1 0  Difficult doing work/chores Somewhat difficult Somewhat difficult Not difficult at all    BP Readings from Last 3 Encounters:  02/25/23 118/78  01/25/23 122/76  01/25/22 120/80    Physical Exam Vitals and nursing note reviewed.  HENT:     Head: Normocephalic.     Right Ear: Tympanic membrane normal.     Left Ear: Tympanic membrane normal.     Nose: Nose normal. No congestion.     Mouth/Throat:     Mouth: Mucous membranes are moist.  Cardiovascular:     Rate and Rhythm: Normal rate.     Heart sounds: No murmur heard.    No gallop.  Pulmonary:     Breath sounds: No wheezing, rhonchi or rales.  Abdominal:     General: Abdomen is flat.     Tenderness: There is no abdominal tenderness. There is no right CVA tenderness or left CVA tenderness.  Neurological:     Mental Status: She is alert.     Wt Readings from Last 3 Encounters:  02/25/23 150 lb (68 kg)  01/25/23 150 lb (68 kg)  01/25/22 145 lb (65.8 kg)    BP 118/78   Pulse 66   Ht 5\' 1"  (1.549 m)   Wt 150 lb (68 kg)   SpO2 98%   BMI 28.34 kg/m   Assessment and Plan:  1. Dysuria New onset.  Persistent.  At least over the last 24 hours.  History and examination is consistent for possible UTI dipstick urinalysis was ordered and it was noted that there were leukocytes and erythrocytes in the urine. - POCT urinalysis dipstick  2. Cystitis New onset.  Persistent.  Stable.  Exam and history and urinary evaluation consistent with cystitis and we will treat with cephalexin 500 mg 3 times a day for 4 days.  Patient's been encouraged to call with unresolved over the weekend. - cephALEXin (KEFLEX) 500 MG capsule; Take 1 capsule (500 mg total) by mouth 3 (three) times daily.  Dispense: 12 capsule; Refill: 0    Otilio Miu, MD

## 2023-08-18 ENCOUNTER — Ambulatory Visit: Payer: BC Managed Care – PPO

## 2023-08-18 DIAGNOSIS — Z23 Encounter for immunization: Secondary | ICD-10-CM

## 2023-08-18 DIAGNOSIS — Z719 Counseling, unspecified: Secondary | ICD-10-CM

## 2023-08-18 NOTE — Progress Notes (Signed)
Pt seen in clinic for pfizer vaccine, eligible per [facility]. Administered Pfizer Comirnaty 12y+, yr 2024-2025. Monitored for 15 minutes and tolerated well. Given VIS and [facility] copy, explained and understood. M.Cataldo Cosgriff, LPN.

## 2023-08-25 ENCOUNTER — Ambulatory Visit (INDEPENDENT_AMBULATORY_CARE_PROVIDER_SITE_OTHER): Payer: BC Managed Care – PPO

## 2023-08-25 DIAGNOSIS — Z23 Encounter for immunization: Secondary | ICD-10-CM

## 2023-08-30 DIAGNOSIS — Z1231 Encounter for screening mammogram for malignant neoplasm of breast: Secondary | ICD-10-CM | POA: Diagnosis not present

## 2023-08-30 DIAGNOSIS — R92323 Mammographic fibroglandular density, bilateral breasts: Secondary | ICD-10-CM | POA: Diagnosis not present

## 2023-11-18 ENCOUNTER — Encounter: Payer: Self-pay | Admitting: Family Medicine

## 2023-11-18 ENCOUNTER — Ambulatory Visit: Payer: BC Managed Care – PPO | Admitting: Family Medicine

## 2023-11-18 VITALS — BP 118/72 | HR 62 | Ht 61.0 in | Wt 147.2 lb

## 2023-11-18 DIAGNOSIS — R233 Spontaneous ecchymoses: Secondary | ICD-10-CM | POA: Diagnosis not present

## 2023-11-18 NOTE — Progress Notes (Signed)
Date:  11/18/2023   Name:  Sandra Donovan   DOB:  1981-01-03   MRN:  [id number]   Chief Complaint: Rash (Rash on Bil ankle x 1 week, itchy. )  Rash This is a new problem. The current episode started in the past 7 days. The problem has been gradually improving since onset. The affected locations include the left ankle and right ankle. The rash is characterized by itchiness. She was exposed to nothing. Pertinent negatives include no congestion, cough, fever, rhinorrhea or sore throat. Past treatments include nothing.    Lab Results  Component Value Date   NA 144 01/25/2023   K 5.1 01/25/2023   CO2 25 01/25/2023   GLUCOSE 93 01/25/2023   BUN 9 01/25/2023   CREATININE 0.61 01/25/2023   CALCIUM 9.3 01/25/2023   EGFR 114 01/25/2023   GFRNONAA 112 01/23/2021   Lab Results  Component Value Date   CHOL 257 (H) 01/25/2023   HDL 53 01/25/2023   LDLCALC 163 (H) 01/25/2023   TRIG 224 (H) 01/25/2023   CHOLHDL 3 12/21/2018   Lab Results  Component Value Date   TSH 1.89 12/21/2018   No results found for: "HGBA1C" Lab Results  Component Value Date   WBC 6.1 01/25/2022   HGB 14.3 01/25/2022   HCT 42.7 01/25/2022   MCV 92 01/25/2022   PLT 304 01/25/2022   Lab Results  Component Value Date   ALT 20 01/25/2023   AST 16 01/25/2023   ALKPHOS 59 01/25/2023   BILITOT 1.2 01/25/2023   Lab Results  Component Value Date   VD25OH 22.28 (L) 12/21/2018     Review of Systems  Constitutional:  Negative for chills and fever.  HENT:  Negative for congestion, ear discharge, nosebleeds, postnasal drip, rhinorrhea, sore throat and trouble swallowing.   Respiratory:  Negative for cough, chest tightness, wheezing and stridor.   Cardiovascular:  Negative for chest pain, palpitations and leg swelling.  Gastrointestinal:  Negative for abdominal distention and blood in stool.  Endocrine: Negative for polydipsia.  Genitourinary:  Negative for hematuria.  Skin:  Positive for rash.    There  are no active problems to display for this patient.   No Known Allergies  History reviewed. No pertinent surgical history.  Social History   Tobacco Use   Smoking status: Never   Smokeless tobacco: Never  Vaping Use   Vaping status: Never Used  Substance Use Topics   Alcohol use: Yes    Comment: ocassionally   Drug use: Never     Medication list has been reviewed and updated.  Current Meds  Medication Sig   Ascorbic Acid (VITAMIN C) 1000 MG tablet Take 1,000 mg by mouth daily.   BIOTIN PO Take 8 mg by mouth daily.   Cholecalciferol (VITAMIN D-3) 125 MCG (5000 UT) TABS Take 1 capsule by mouth daily.   Coenzyme Q10 (COQ-10 PO) Take 200 mg by mouth daily.   Quercetin 500 MG CAPS Take 1 capsule by mouth daily.   Red Yeast Rice Extract (RED YEAST RICE PO) Take 1 tablet by mouth daily.   vitamin B-12 (CYANOCOBALAMIN) 1000 MCG tablet Take 1,000 mcg by mouth daily.   Zinc 30 MG CAPS Take 1 capsule by mouth daily.       02/25/2023    8:03 AM 01/25/2023    8:45 AM 01/25/2022    8:54 AM 01/23/2021    8:51 AM  GAD 7 : Generalized Anxiety Score  Nervous, Anxious, on Edge 0  0 0 0  Control/stop worrying 0 0 0 1  Worry too much - different things 1 1 0 1  Trouble relaxing 1 1 0 0  Restless 0 0 0 0  Easily annoyed or irritable 0 0 0 0  Afraid - awful might happen 0 0 0 1  Total GAD 7 Score 2 2 0 3  Anxiety Difficulty Not difficult at all Not difficult at all Not difficult at all Not difficult at all       02/25/2023    8:03 AM 01/25/2023    8:44 AM 01/25/2022    8:54 AM  Depression screen PHQ 2/9  Decreased Interest 0 0 0  Down, Depressed, Hopeless 0 0 0  PHQ - 2 Score 0 0 0  Altered sleeping 1 1 0  Tired, decreased energy 0 0 0  Change in appetite 0 0 0  Feeling bad or failure about yourself  0 0 0  Trouble concentrating 0 0 0  Moving slowly or fidgety/restless 0 0 0  Suicidal thoughts 0 0 0  PHQ-9 Score 1 1 0  Difficult doing work/chores Somewhat difficult Somewhat  difficult Not difficult at all    BP Readings from Last 3 Encounters:  11/18/23 118/72  02/25/23 118/78  01/25/23 122/76    Physical Exam Vitals and nursing note reviewed.  HENT:     Head: Normocephalic.     Right Ear: Tympanic membrane and ear canal normal.     Left Ear: Tympanic membrane and ear canal normal.     Nose: Nose normal. No congestion or rhinorrhea.     Mouth/Throat:     Mouth: Mucous membranes are moist.     Pharynx: No oropharyngeal exudate.  Eyes:     Pupils: Pupils are equal, round, and reactive to light.  Cardiovascular:     Rate and Rhythm: Normal rate and regular rhythm.     Heart sounds: No murmur heard.    No friction rub. No gallop.  Pulmonary:     Breath sounds: No wheezing, rhonchi or rales.  Abdominal:     Tenderness: There is no guarding.  Musculoskeletal:     Cervical back: Normal range of motion.     Wt Readings from Last 3 Encounters:  11/18/23 147 lb 3.2 oz (66.8 kg)  02/25/23 150 lb (68 kg)  01/25/23 150 lb (68 kg)    BP 118/72   Pulse 62   Ht 5\' 1"  (1.549 m)   Wt 147 lb 3.2 oz (66.8 kg)   SpO2 94%   BMI 27.81 kg/m   Assessment and Plan:  1. Traumatic petechiae (Primary) New onset.  Gradually improving.  Stable.  Examination with glass slide is consistent with petechiae.  Patient recently has changed her exercise regimen from just swimming to incorporating running with hiking this past weekend which this episode occurred the next day and swimming as well since she is training for a triathlon.  Examination is consistent with petechiae and these are likely due to the increased and exercising and involving pounding of the legs to either pavement or to mountain trails.  We will check a CBC to make sure the platelet count is normal and that patient does not have an anemia.  Will recheck patient on an as-needed basis - CBC with Differential/Platelet    Elizabeth Sauer, MD

## 2023-11-25 DIAGNOSIS — R233 Spontaneous ecchymoses: Secondary | ICD-10-CM | POA: Diagnosis not present

## 2023-11-26 ENCOUNTER — Encounter: Payer: Self-pay | Admitting: Family Medicine

## 2023-11-26 LAB — CBC WITH DIFFERENTIAL/PLATELET
Basophils Absolute: 0 10*3/uL (ref 0.0–0.2)
Basos: 1 %
EOS (ABSOLUTE): 0.1 10*3/uL (ref 0.0–0.4)
Eos: 2 %
Hematocrit: 40.2 % (ref 34.0–46.6)
Hemoglobin: 13.3 g/dL (ref 11.1–15.9)
Immature Grans (Abs): 0 10*3/uL (ref 0.0–0.1)
Immature Granulocytes: 0 %
Lymphocytes Absolute: 2.1 10*3/uL (ref 0.7–3.1)
Lymphs: 36 %
MCH: 31.4 pg (ref 26.6–33.0)
MCHC: 33.1 g/dL (ref 31.5–35.7)
MCV: 95 fL (ref 79–97)
Monocytes Absolute: 0.5 10*3/uL (ref 0.1–0.9)
Monocytes: 9 %
Neutrophils Absolute: 3 10*3/uL (ref 1.4–7.0)
Neutrophils: 52 %
Platelets: 270 10*3/uL (ref 150–450)
RBC: 4.23 x10E6/uL (ref 3.77–5.28)
RDW: 11.9 % (ref 11.7–15.4)
WBC: 5.7 10*3/uL (ref 3.4–10.8)

## 2024-01-27 ENCOUNTER — Encounter: Payer: BC Managed Care – PPO | Admitting: Family Medicine

## 2024-02-15 ENCOUNTER — Encounter: Payer: Self-pay | Admitting: Family Medicine

## 2024-02-22 DIAGNOSIS — Z01419 Encounter for gynecological examination (general) (routine) without abnormal findings: Secondary | ICD-10-CM | POA: Diagnosis not present

## 2024-03-06 ENCOUNTER — Ambulatory Visit (INDEPENDENT_AMBULATORY_CARE_PROVIDER_SITE_OTHER): Payer: Self-pay | Admitting: Family Medicine

## 2024-03-06 ENCOUNTER — Encounter: Payer: Self-pay | Admitting: Family Medicine

## 2024-03-06 VITALS — BP 118/88 | HR 55 | Ht 61.0 in | Wt 146.0 lb

## 2024-03-06 DIAGNOSIS — R079 Chest pain, unspecified: Secondary | ICD-10-CM

## 2024-03-06 DIAGNOSIS — Z1159 Encounter for screening for other viral diseases: Secondary | ICD-10-CM | POA: Diagnosis not present

## 2024-03-06 DIAGNOSIS — Z0000 Encounter for general adult medical examination without abnormal findings: Secondary | ICD-10-CM

## 2024-03-06 DIAGNOSIS — Z86018 Personal history of other benign neoplasm: Secondary | ICD-10-CM | POA: Diagnosis not present

## 2024-03-06 NOTE — Progress Notes (Signed)
 Date:  03/06/2024   Name:  Sandra Donovan   DOB:  September 02, 1981   MRN:  [id number]   Chief Complaint: Annual Exam  Patient is a 43 year old female who presents for a comprehensive physical exam. The patient reports the following problems: none. Health maintenance has been reviewed needs hep c    Chest Pain  This is a new problem. The current episode started more than 1 year ago. The onset quality is gradual. The problem occurs intermittently. The problem has been unchanged. The pain is present in the substernal region (left to substernal). The quality of the pain is described as sharp. The pain does not radiate. Associated symptoms include a cough, dizziness and shortness of breath. Pertinent negatives include no headaches or palpitations. Nothing worsens the cough. The pain is aggravated by nothing.    Lab Results  Component Value Date   NA 144 01/25/2023   K 5.1 01/25/2023   CO2 25 01/25/2023   GLUCOSE 93 01/25/2023   BUN 9 01/25/2023   CREATININE 0.61 01/25/2023   CALCIUM 9.3 01/25/2023   EGFR 114 01/25/2023   GFRNONAA 112 01/23/2021   Lab Results  Component Value Date   CHOL 257 (H) 01/25/2023   HDL 53 01/25/2023   LDLCALC 163 (H) 01/25/2023   TRIG 224 (H) 01/25/2023   CHOLHDL 3 12/21/2018   Lab Results  Component Value Date   TSH 1.89 12/21/2018   No results found for: "HGBA1C" Lab Results  Component Value Date   WBC 5.7 11/25/2023   HGB 13.3 11/25/2023   HCT 40.2 11/25/2023   MCV 95 11/25/2023   PLT 270 11/25/2023   Lab Results  Component Value Date   ALT 20 01/25/2023   AST 16 01/25/2023   ALKPHOS 59 01/25/2023   BILITOT 1.2 01/25/2023   Lab Results  Component Value Date   VD25OH 22.28 (L) 12/21/2018     Review of Systems  Constitutional:  Negative for fatigue and unexpected weight change.  HENT:  Negative for trouble swallowing.   Eyes:  Negative for visual disturbance.  Respiratory:  Positive for cough and shortness of breath. Negative for chest  tightness and wheezing.   Cardiovascular:  Positive for chest pain. Negative for palpitations and leg swelling.  Gastrointestinal:  Negative for abdominal distention and blood in stool.  Endocrine: Positive for polydipsia. Negative for polyuria.  Genitourinary:  Negative for hematuria and vaginal bleeding.  Neurological:  Positive for dizziness. Negative for headaches.  Hematological:  Negative for adenopathy. Does not bruise/bleed easily.    There are no active problems to display for this patient.   No Known Allergies  No past surgical history on file.  Social History   Tobacco Use   Smoking status: Never   Smokeless tobacco: Never  Vaping Use   Vaping status: Never Used  Substance Use Topics   Alcohol use: Yes    Comment: ocassionally   Drug use: Never     Medication list has been reviewed and updated.  Current Meds  Medication Sig   Ascorbic Acid (VITAMIN C) 1000 MG tablet Take 1,000 mg by mouth daily.   BIOTIN PO Take 8 mg by mouth daily.   Cholecalciferol (VITAMIN D-3) 125 MCG (5000 UT) TABS Take 1 capsule by mouth daily.   Coenzyme Q10 (COQ-10 PO) Take 200 mg by mouth daily.   Quercetin 500 MG CAPS Take 1 capsule by mouth daily.   Red Yeast Rice Extract (RED YEAST RICE PO) Take 1  tablet by mouth daily.   vitamin B-12 (CYANOCOBALAMIN) 1000 MCG tablet Take 1,000 mcg by mouth daily.   Zinc 30 MG CAPS Take 1 capsule by mouth daily.       03/06/2024    8:49 AM 02/25/2023    8:03 AM 01/25/2023    8:45 AM 01/25/2022    8:54 AM  GAD 7 : Generalized Anxiety Score  Nervous, Anxious, on Edge 1 0 0 0  Control/stop worrying 0 0 0 0  Worry too much - different things 1 1 1  0  Trouble relaxing 1 1 1  0  Restless 0 0 0 0  Easily annoyed or irritable 0 0 0 0  Afraid - awful might happen 0 0 0 0  Total GAD 7 Score 3 2 2  0  Anxiety Difficulty  Not difficult at all Not difficult at all Not difficult at all       03/06/2024    8:48 AM 02/25/2023    8:03 AM 01/25/2023    8:44  AM  Depression screen PHQ 2/9  Decreased Interest 0 0 0  Down, Depressed, Hopeless 1 0 0  PHQ - 2 Score 1 0 0  Altered sleeping 1 1 1   Tired, decreased energy 0 0 0  Change in appetite 0 0 0  Feeling bad or failure about yourself  0 0 0  Trouble concentrating 0 0 0  Moving slowly or fidgety/restless 0 0 0  Suicidal thoughts 0 0 0  PHQ-9 Score 2 1 1   Difficult doing work/chores  Somewhat difficult Somewhat difficult    BP Readings from Last 3 Encounters:  03/06/24 118/88  11/18/23 118/72  02/25/23 118/78    Physical Exam Vitals and nursing note reviewed.  Constitutional:      Appearance: Normal appearance. She is well-groomed and normal weight.  HENT:     Head: Normocephalic.     Jaw: There is normal jaw occlusion.     Right Ear: Hearing, tympanic membrane, ear canal and external ear normal.     Left Ear: Hearing, tympanic membrane, ear canal and external ear normal.     Mouth/Throat:     Lips: Pink.     Mouth: Mucous membranes are moist.     Tongue: No lesions.     Palate: No mass.     Pharynx: Oropharynx is clear. Uvula midline.  Eyes:     General: Lids are normal. Vision grossly intact. Gaze aligned appropriately.     Extraocular Movements: Extraocular movements intact.  Neck:     Thyroid: No thyroid mass, thyromegaly or thyroid tenderness.     Vascular: Normal carotid pulses. No carotid bruit, hepatojugular reflux or JVD.     Trachea: Trachea and phonation normal.  Cardiovascular:     Rate and Rhythm: Normal rate and regular rhythm.     Chest Wall: PMI is not displaced.     Pulses: Normal pulses.          Carotid pulses are 2+ on the right side and 2+ on the left side.      Radial pulses are 2+ on the right side and 2+ on the left side.       Femoral pulses are 2+ on the right side and 2+ on the left side.      Popliteal pulses are 2+ on the right side and 2+ on the left side.       Dorsalis pedis pulses are 2+ on the right side and 2+ on the left side.  Posterior tibial pulses are 2+ on the right side and 2+ on the left side.     Heart sounds: Normal heart sounds, S1 normal and S2 normal. Heart sounds not distant. No murmur heard.    No systolic murmur is present.     No diastolic murmur is present.     No friction rub. No gallop. No S3 or S4 sounds.  Pulmonary:     Effort: Pulmonary effort is normal.     Breath sounds: Normal breath sounds. No decreased breath sounds, wheezing, rhonchi or rales.  Chest:     Chest wall: No deformity or tenderness.  Breasts:    Right: Absent. No swelling, bleeding, inverted nipple, mass, nipple discharge, skin change or tenderness.     Left: Absent. No swelling, bleeding, inverted nipple, mass, nipple discharge, skin change or tenderness.  Abdominal:     General: Abdomen is flat. Bowel sounds are increased.     Palpations: Abdomen is soft. There is no hepatomegaly or splenomegaly.     Tenderness: There is no abdominal tenderness. There is no right CVA tenderness, left CVA tenderness, guarding or rebound.     Hernia: No hernia is present.  Genitourinary:    Rectum: Normal. Guaiac result negative. No mass.  Musculoskeletal:     Cervical back: Full passive range of motion without pain and normal range of motion.     Right lower leg: No edema.     Left lower leg: No edema.  Lymphadenopathy:     Head:     Right side of head: No submandibular adenopathy.     Left side of head: No submandibular adenopathy.     Cervical:     Right cervical: No superficial, deep or posterior cervical adenopathy.    Left cervical: No superficial, deep or posterior cervical adenopathy.     Upper Body:     Right upper body: No supraclavicular or axillary adenopathy.     Left upper body: No supraclavicular or axillary adenopathy.     Lower Body: No right inguinal adenopathy. No left inguinal adenopathy.  Neurological:     Mental Status: She is alert.     Cranial Nerves: Cranial nerves 2-12 are intact.     Sensory: Sensation  is intact.     Motor: Motor function is intact.     Coordination: Romberg sign negative.     Deep Tendon Reflexes: Reflexes are normal and symmetric.     Reflex Scores:      Tricep reflexes are 2+ on the right side and 2+ on the left side.      Bicep reflexes are 2+ on the right side and 2+ on the left side.      Brachioradialis reflexes are 2+ on the right side and 2+ on the left side.      Patellar reflexes are 2+ on the right side and 2+ on the left side.      Achilles reflexes are 2+ on the right side and 2+ on the left side. Psychiatric:        Behavior: Behavior is cooperative.     Wt Readings from Last 3 Encounters:  03/06/24 146 lb (66.2 kg)  11/18/23 147 lb 3.2 oz (66.8 kg)  02/25/23 150 lb (68 kg)    BP 118/88   Pulse (!) 55   Ht 5\' 1"  (1.549 m)   Wt 146 lb (66.2 kg)   LMP 03/05/2024   SpO2 99%   BMI 27.59 kg/m   Assessment and  Plan:  Carrissa Taitano is a 43 y.o. female who presents today for her Complete Annual Exam. She feels . She reports exercising swimming. She reports she is sleeping some difficullty initiatiation.. Immunizations are reviewed and recommendations provided.   Age appropriate screening tests are discussed. Counseling given for risk factor reduction interventions.  1. Annual physical exam (Primary) No subjective/objective concerns noted during HPI, review of past medical history/review of medications/review of labs within 6 months, review of systems and physical exam.  Will check CMP lipid panel and CBC as baseline physical labs. - Comprehensive metabolic panel with GFR - Lipid Panel With LDL/HDL Ratio - CBC with Differential/Platelet  2. Chest pain, unspecified type Patient with onset of chest pain sharp in nature to the left of sternal occurring at rest atypical for ischemia but patient is doing his swimming program that is aerobic and we will check EKG with the following results.  Regular rhythm rate 50 intervals normal voltage criteria not met  for ventricular hypertrophy, no ischemic EKG indications such as Q waves, ST-T wave changes nor delay in R wave progression.  This is likely not cardiac in origin and more musculoskeletal but patient has been informed that if starts having pain particularly with exertion that we need to do further evaluation or seek emergency care. - EKG 12-Lead  3. Need for hepatitis C screening test Patient would like to be checked for hepatitis C and that it is coming up on other maintenance exams.  We will check HCV antibody. - Hepatitis C antibody  4. History of uterine fibroid Patient with history of uterine fibroids is followed [facility] and will continue with follow-up there as well as for her mammograms as well.  Elizabeth Sauer, MD

## 2024-03-07 LAB — CBC WITH DIFFERENTIAL/PLATELET
Basophils Absolute: 0.1 10*3/uL (ref 0.0–0.2)
Basos: 1 %
EOS (ABSOLUTE): 0.3 10*3/uL (ref 0.0–0.4)
Eos: 3 %
Hematocrit: 44.6 % (ref 34.0–46.6)
Hemoglobin: 14.2 g/dL (ref 11.1–15.9)
Immature Grans (Abs): 0 10*3/uL (ref 0.0–0.1)
Immature Granulocytes: 0 %
Lymphocytes Absolute: 2.4 10*3/uL (ref 0.7–3.1)
Lymphs: 32 %
MCH: 29.5 pg (ref 26.6–33.0)
MCHC: 31.8 g/dL (ref 31.5–35.7)
MCV: 93 fL (ref 79–97)
Monocytes Absolute: 0.6 10*3/uL (ref 0.1–0.9)
Monocytes: 8 %
Neutrophils Absolute: 4.2 10*3/uL (ref 1.4–7.0)
Neutrophils: 56 %
Platelets: 270 10*3/uL (ref 150–450)
RBC: 4.81 x10E6/uL (ref 3.77–5.28)
RDW: 12 % (ref 11.7–15.4)
WBC: 7.5 10*3/uL (ref 3.4–10.8)

## 2024-03-07 LAB — COMPREHENSIVE METABOLIC PANEL WITH GFR
ALT: 15 IU/L (ref 0–32)
AST: 16 IU/L (ref 0–40)
Albumin: 4.6 g/dL (ref 3.9–4.9)
Alkaline Phosphatase: 54 IU/L (ref 44–121)
BUN/Creatinine Ratio: 9 (ref 9–23)
BUN: 6 mg/dL (ref 6–24)
Bilirubin Total: 1.4 mg/dL — ABNORMAL HIGH (ref 0.0–1.2)
CO2: 23 mmol/L (ref 20–29)
Calcium: 9.4 mg/dL (ref 8.7–10.2)
Chloride: 103 mmol/L (ref 96–106)
Creatinine, Ser: 0.65 mg/dL (ref 0.57–1.00)
Globulin, Total: 2.2 g/dL (ref 1.5–4.5)
Glucose: 81 mg/dL (ref 70–99)
Potassium: 4.7 mmol/L (ref 3.5–5.2)
Sodium: 140 mmol/L (ref 134–144)
Total Protein: 6.8 g/dL (ref 6.0–8.5)
eGFR: 111 mL/min/{1.73_m2} (ref >59)

## 2024-03-07 LAB — LIPID PANEL WITH LDL/HDL RATIO
Cholesterol, Total: 226 mg/dL — ABNORMAL HIGH (ref 100–199)
HDL: 50 mg/dL (ref >39)
LDL Chol Calc (NIH): 146 mg/dL — ABNORMAL HIGH (ref 0–99)
LDL/HDL Ratio: 2.9 ratio (ref 0.0–3.2)
Triglycerides: 166 mg/dL — ABNORMAL HIGH (ref 0–149)
VLDL Cholesterol Cal: 30 mg/dL (ref 5–40)

## 2024-03-07 LAB — HEPATITIS C ANTIBODY: Hep C Virus Ab: NONREACTIVE

## 2024-11-08 ENCOUNTER — Other Ambulatory Visit: Payer: Self-pay | Admitting: Nurse Practitioner

## 2024-11-08 DIAGNOSIS — Z1231 Encounter for screening mammogram for malignant neoplasm of breast: Secondary | ICD-10-CM

## 2024-12-05 ENCOUNTER — Ambulatory Visit
Admission: RE | Admit: 2024-12-05 | Discharge: 2024-12-05 | Disposition: A | Discharge status: Home / Self Care | Source: Ambulatory Visit | Attending: Nurse Practitioner | Admitting: Nurse Practitioner

## 2024-12-05 DIAGNOSIS — Z1231 Encounter for screening mammogram for malignant neoplasm of breast: Secondary | ICD-10-CM

## 2025-03-08 ENCOUNTER — Ambulatory Visit: Admitting: Pediatrics

## 2025-03-11 ENCOUNTER — Encounter: Admitting: Nurse Practitioner
